# Patient Record
Sex: Female | Born: 1990 | Race: Asian | Hispanic: No | Marital: Single | Smoking: Never smoker
Health system: Southern US, Community
[De-identification: ages and names within clinical notes are randomized; demographics above are authoritative.]

## PROBLEM LIST (undated history)

## (undated) DIAGNOSIS — Z789 Other specified health status: Secondary | ICD-10-CM

## (undated) DIAGNOSIS — B1910 Unspecified viral hepatitis B without hepatic coma: Secondary | ICD-10-CM

## (undated) HISTORY — PX: NO PAST SURGERIES: SHX2092

---

## 2019-01-20 ENCOUNTER — Inpatient Hospital Stay (HOSPITAL_COMMUNITY)
Admission: AD | Admit: 2019-01-20 | Discharge: 2019-01-20 | Disposition: A | Discharge status: Home / Self Care | Payer: Medicaid Other | Attending: Obstetrics & Gynecology | Admitting: Obstetrics & Gynecology

## 2019-01-20 ENCOUNTER — Encounter (HOSPITAL_COMMUNITY): Payer: Self-pay

## 2019-01-20 DIAGNOSIS — E860 Dehydration: Secondary | ICD-10-CM

## 2019-01-20 DIAGNOSIS — O210 Mild hyperemesis gravidarum: Secondary | ICD-10-CM

## 2019-01-20 DIAGNOSIS — Z3A09 9 weeks gestation of pregnancy: Secondary | ICD-10-CM | POA: Insufficient documentation

## 2019-01-20 DIAGNOSIS — O211 Hyperemesis gravidarum with metabolic disturbance: Secondary | ICD-10-CM | POA: Insufficient documentation

## 2019-01-20 LAB — URINALYSIS, ROUTINE W REFLEX MICROSCOPIC
Bilirubin Urine: NEGATIVE
Glucose, UA: 50 mg/dL — AB
Hgb urine dipstick: NEGATIVE
Ketones, ur: 5 mg/dL — AB
Nitrite: NEGATIVE
Protein, ur: NEGATIVE mg/dL
Specific Gravity, Urine: 1.031 — ABNORMAL HIGH (ref 1.005–1.030)
pH: 5 (ref 5.0–8.0)

## 2019-01-20 LAB — POCT PREGNANCY, URINE: Preg Test, Ur: POSITIVE — AB

## 2019-01-20 MED ORDER — PROMETHAZINE HCL 25 MG/ML IJ SOLN
12.5000 mg | Freq: Four times a day (QID) | INTRAMUSCULAR | Status: DC | PRN
  Administered 2019-01-20: 12.5 mg via INTRAVENOUS
  Filled 2019-01-20: qty 1

## 2019-01-20 MED ORDER — LACTATED RINGERS IV BOLUS
1000.0000 mL | Freq: Once | INTRAVENOUS | Status: AC
  Administered 2019-01-20: 17:00:00 1000 mL via INTRAVENOUS

## 2019-01-20 MED ORDER — PROMETHAZINE HCL 25 MG PO TABS: 12.5000 mg | ORAL_TABLET | Freq: Four times a day (QID) | ORAL | 0 refills | Status: DC | PRN

## 2019-01-20 MED ORDER — SCOPOLAMINE 1 MG/3DAYS TD PT72
1.0000 | MEDICATED_PATCH | TRANSDERMAL | Status: DC
  Administered 2019-01-20: 1.5 mg via TRANSDERMAL
  Filled 2019-01-20: qty 1

## 2019-01-20 MED ORDER — SCOPOLAMINE 1 MG/3DAYS TD PT72: 1.0000 | MEDICATED_PATCH | TRANSDERMAL | 1 refills | Status: DC

## 2019-01-20 NOTE — MAU Provider Note (Signed)
History     CSN: [id number]  Arrival date and time: 01/20/19 1458   First Provider Initiated Contact with Patient 01/20/19 1619      Chief Complaint  Patient presents with  . Nausea   28 y.o. G1 @[redacted]w[redacted]d  by sure LMP presenting with N/V. Reports nausea x2 weeks, mostly in am. Had 2 episodes of emesis. She is able to tolerate liquids, pasta, rice, and noodles. Denies abd pain or VB. No diarrhea. No sick contacts.   OB History    Gravida  1   Para      Term      Preterm      AB      Living        SAB      TAB      Ectopic      Multiple      Live Births              History reviewed. No pertinent past medical history.  History reviewed. No pertinent surgical history.  Family History  Problem Relation Age of Onset  . Healthy Mother   . Healthy Father     Social History   Tobacco Use  . Smoking status: Never Smoker  . Smokeless tobacco: Never Used  Substance Use Topics  . Alcohol use: Not Currently    Comment: Socillay  . Drug use: Never    Allergies: No Known Allergies  No medications prior to admission.    Review of Systems  Constitutional: Negative for chills and fever.  Gastrointestinal: Positive for nausea and vomiting. Negative for abdominal pain, constipation and diarrhea.  Genitourinary: Negative for vaginal bleeding.   Physical Exam   Blood pressure 109/65, pulse 82, temperature 99.2 F (37.3 C), temperature source Oral, resp. rate 14, height 5\' 1"  (1.549 m), weight 50.7 kg, last menstrual period 11/16/2018, SpO2 100 %.  Physical Exam  Nursing note and vitals reviewed. Constitutional: She is oriented to person, place, and time. She appears well-developed and well-nourished. No distress.  HENT:  Head: Normocephalic and atraumatic.  Neck: Normal range of motion.  Cardiovascular: Normal rate.  Respiratory: Effort normal. No respiratory distress.  GI: Soft. She exhibits no distension and no mass. There is no abdominal tenderness.  There is no rebound and no guarding.  Musculoskeletal: Normal range of motion.  Neurological: She is alert and oriented to person, place, and time.  Skin: Skin is warm and dry.  Psychiatric: She has a normal mood and affect.   Results for orders placed or performed during the hospital encounter of 01/20/19 (from the past 24 hour(s))  Urinalysis, Routine w reflex microscopic     Status: Abnormal   Collection Time: 01/20/19  3:41 PM  Result Value Ref Range   Color, Urine YELLOW YELLOW   APPearance TURBID (A) CLEAR   Specific Gravity, Urine 1.031 (H) 1.005 - 1.030   pH 5.0 5.0 - 8.0   Glucose, UA 50 (A) NEGATIVE mg/dL   Hgb urine dipstick NEGATIVE NEGATIVE   Bilirubin Urine NEGATIVE NEGATIVE   Ketones, ur 5 (A) NEGATIVE mg/dL   Protein, ur NEGATIVE NEGATIVE mg/dL   Nitrite NEGATIVE NEGATIVE   Leukocytes,Ua SMALL (A) NEGATIVE   RBC / HPF 0-5 0 - 5 RBC/hpf   WBC, UA 11-20 0 - 5 WBC/hpf   Bacteria, UA MANY (A) NONE SEEN   Squamous Epithelial / LPF 21-50 0 - 5   Mucus PRESENT   Pregnancy, urine POC     Status: Abnormal  Collection Time: 01/20/19  3:55 PM  Result Value Ref Range   Preg Test, Ur POSITIVE (A) NEGATIVE   MAU Course  Procedures  MDM Labs ordered and reviewed. Feeling better after meds and IVF. No emesis. Tolerating po. UA with many bacteria, pt asymptomatic, will send culture. Stable for discharge home.  Assessment and Plan   1. [redacted] weeks gestation of pregnancy   2. Morning sickness   3. Dehydration    Discharge home Follow up with OB provider of choice to start care Rx Phenergan Rx Scopolamine Start PNV Maintain hydration Return precautions  Allergies as of 01/20/2019   No Known Allergies     Medication List    TAKE these medications   promethazine 25 MG tablet Commonly known as: PHENERGAN Take 0.5-1 tablets (12.5-25 mg total) by mouth every 6 (six) hours as needed for nausea or vomiting.   scopolamine 1 MG/3DAYS Commonly known as:  TRANSDERM-SCOP Place 1 patch (1.5 mg total) onto the skin every 3 (three) days. Start taking on: January 23, 2019      Krystal Lloyd, [place] 01/20/2019, 7:12 PM

## 2019-01-20 NOTE — Discharge Instructions (Signed)
Morning Sickness  Morning sickness is when you feel sick to your stomach (nauseous) during pregnancy. You may feel sick to your stomach and throw up (vomit). You may feel sick in the morning, but you can feel this way at any time of day. Some women feel very sick to their stomach and cannot stop throwing up (hyperemesis gravidarum). Follow these instructions at home: Medicines  Take over-the-counter and prescription medicines only as told by your doctor. Do not take any medicines until you talk with your doctor about them first.  Taking multivitamins before getting pregnant can stop or lessen the harshness of morning sickness. Eating and drinking  Eat dry toast or crackers before getting out of bed.  Eat 5 or 6 small meals a day.  Eat dry and bland foods like rice and baked potatoes.  Do not eat greasy, fatty, or spicy foods.  Have someone cook for you if the smell of food causes you to feel sick or throw up.  If you feel sick to your stomach after taking prenatal vitamins, take them at night or with a snack.  Eat protein when you need a snack. Nuts, yogurt, and cheese are good choices.  Drink fluids throughout the day.  Try ginger ale made with real ginger, ginger tea made from fresh grated ginger, or ginger candies. General instructions  Do not use any products that have nicotine or tobacco in them, such as cigarettes and e-cigarettes. If you need help quitting, ask your doctor.  Use an air purifier to keep the air in your house free of smells.  Get lots of fresh air.  Try to avoid smells that make you feel sick.  Try: ? Wearing a bracelet that is used for seasickness (acupressure wristband). ? Going to a doctor who puts thin needles into certain body points (acupuncture) to improve how you feel. Contact a doctor if:  You need medicine to feel better.  You feel dizzy or light-headed.  You are losing weight. Get help right away if:  You feel very sick to your  stomach and cannot stop throwing up.  You pass out (faint).  You have very bad pain in your belly. Summary  Morning sickness is when you feel sick to your stomach (nauseous) during pregnancy.  You may feel sick in the morning, but you can feel this way at any time of day.  Making some changes to what you eat may help your symptoms go away. This information is not intended to replace advice given to you by your health care provider. Make sure you discuss any questions you have with your health care provider. Document Released: 04/24/2004 Document Revised: 02/27/2017 Document Reviewed: 04/17/2016 Elsevier Patient Education  2020 [facility]     Phone: [phone]  [facility] [place] [facility]  Phone: [phone]  [facility] [place] [facility] [place]  Phone: [place] [facility] [employer] at [place]                           Phone: [place] [facility] [place] [facility]          Phone: [phone]  [facility]    Phone: [phone]   [facility] [place])    Phone: [facility]    Phone: [place]  Phone: [place]    Phone: [phone]  [place] [facility]  Phone: [phone]  [place]               Phone: [phone]  [facility]   Phone: [phone]  [facility]    Phone: [phone]

## 2019-01-20 NOTE — MAU Note (Signed)
Pt has been experiencing nausea and vomiting for 2 weeks.  Her LMP was Aug 18. Denies bleeding or pain. Had positive HPT.

## 2019-01-21 LAB — CULTURE, OB URINE

## 2019-04-01 NOTE — L&D Delivery Note (Signed)
Delivery Note Tyannah Bazzle is a 29 y.o. G1P0000 at [redacted]w[redacted]d admitted for SOL.  Labor course: AROM only, progressed well on her own ROM: 3h 56m with clear fluid  Called to room by nurse who feels anterior lip. Anterior lip reduced by me as pt pushed and was able to push past it. At 1908 a viable female was delivered via spontaneous vaginal delivery (Presentation: LOA). Shoulders were snug, she was placed in [facility] and infant delivered w/o difficulty.  Infant placed directly on mom's abdomen for bonding/skin-to-skin. Delayed cord clamping x 18min, then cord clamped x 2, and cut by FOB.  APGAR: 9,9 ; weight: pending at time of note.  40 units of pitocin diluted in 1000cc LR was infused rapidly IV per protocol. The placenta separated spontaneously and delivered via CCT and maternal pushing effort.  It was inspected and appears to be intact with a 3 VC.  Placenta/Cord with the following complications: marginal insertion .  Cord pH: not done Steady trickle, boggy uterus firmed w/ massage, then steady trickle again. Given 840mcg cytotec total- 448mcg SL and 470mcg PR with nice continued firming of uterus.   Intrapartum complications:  None Anesthesia:  local and epidural Episiotomy: none Lacerations:  2nd degree Suture Repair: 3.0 vicryl Est. Blood Loss (mL): 300 Sponge and instrument count were correct x2.  Mom to postpartum.  Baby to Couplet care / Skin to Skin. Placenta to L&D. Plans to breast and bottlefeed Contraception: none Circ: no  [name] CNM, [facility] 08/16/2019 7:46 PM

## 2019-04-08 ENCOUNTER — Ambulatory Visit (INDEPENDENT_AMBULATORY_CARE_PROVIDER_SITE_OTHER): Payer: Medicaid Other | Admitting: Family Medicine

## 2019-04-08 ENCOUNTER — Other Ambulatory Visit (HOSPITAL_COMMUNITY)
Admission: RE | Admit: 2019-04-08 | Discharge: 2019-04-08 | Disposition: A | Discharge status: Home / Self Care | Payer: Medicaid Other | Source: Ambulatory Visit | Attending: Family Medicine | Admitting: Family Medicine

## 2019-04-08 ENCOUNTER — Encounter: Payer: Self-pay | Admitting: Family Medicine

## 2019-04-08 ENCOUNTER — Other Ambulatory Visit: Payer: Self-pay

## 2019-04-08 VITALS — BP 125/70 | HR 81 | Wt 128.0 lb

## 2019-04-08 DIAGNOSIS — Z3402 Encounter for supervision of normal first pregnancy, second trimester: Secondary | ICD-10-CM | POA: Diagnosis present

## 2019-04-08 DIAGNOSIS — O0932 Supervision of pregnancy with insufficient antenatal care, second trimester: Secondary | ICD-10-CM

## 2019-04-08 DIAGNOSIS — Z3A20 20 weeks gestation of pregnancy: Secondary | ICD-10-CM | POA: Diagnosis not present

## 2019-04-08 DIAGNOSIS — Z3403 Encounter for supervision of normal first pregnancy, third trimester: Secondary | ICD-10-CM | POA: Insufficient documentation

## 2019-04-08 NOTE — Progress Notes (Signed)
  Subjective:  Krystal Lloyd is a G1P0 [redacted]w[redacted]d being seen today for her first obstetrical visit.  Her obstetrical history is significant for first pregnancy and late to care. unplanned pregnancy, but excited. FOB is fiancee. Patient does intend to breast feed. Pregnancy history fully reviewed.  Patient reports no complaints.  BP 125/70   Pulse 81   Wt 128 lb (58.1 kg)   LMP 11/16/2018   BMI 24.19 kg/m   HISTORY: OB History  Gravida Para Term Preterm AB Living  1            SAB TAB Ectopic Multiple Live Births               # Outcome Date GA Lbr Len/2nd Weight Sex Delivery Anes PTL Lv  1 Current             History reviewed. No pertinent past medical history.  History reviewed. No pertinent surgical history.  Family History  Problem Relation Age of Onset  . Healthy Mother   . Healthy Father   . Cancer Neg Hx      Exam  BP 125/70   Pulse 81   Wt 128 lb (58.1 kg)   LMP 11/16/2018   BMI 24.19 kg/m   Chaperone present during exam  CONSTITUTIONAL: Well-developed, well-nourished female in no acute distress.  HENT:  Normocephalic, atraumatic, External right and left ear normal. Oropharynx is clear and moist EYES: Conjunctivae and EOM are normal. Pupils are equal, round, and reactive to light. No scleral icterus.  NECK: Normal range of motion, supple, no masses.  Normal thyroid.  CARDIOVASCULAR: Normal heart rate noted, regular rhythm RESPIRATORY: Clear to auscultation bilaterally. Effort and breath sounds normal, no problems with respiration noted. BREASTS: Symmetric in size. No masses, skin changes, nipple drainage, or lymphadenopathy. ABDOMEN: Soft, normal bowel sounds, no distention noted.  No tenderness, rebound or guarding.  PELVIC: Normal appearing external genitalia; normal appearing vaginal mucosa and cervix. No abnormal discharge noted. Normal uterine size, no other palpable masses, no uterine or adnexal tenderness. MUSCULOSKELETAL: Normal range of motion. No  tenderness.  No cyanosis, clubbing, or edema.  2+ distal pulses. SKIN: Skin is warm and dry. No rash noted. Not diaphoretic. No erythema. No pallor. NEUROLOGIC: Alert and oriented to person, place, and time. Normal reflexes, muscle tone coordination. No cranial nerve deficit noted. PSYCHIATRIC: Normal mood and affect. Normal behavior. Normal judgment and thought content.    Assessment:    Pregnancy: G1P0 Patient Active Problem List   Diagnosis Date Noted  . Encounter for supervision of normal first pregnancy in second trimester 04/08/2019      Plan:   1. Encounter for supervision of normal first pregnancy in second trimester FHT and FH normal - Obstetric Panel (and HIV)*LC - [place] MFM OB DETAIL +14 WK; Future - Urine Culture - Cytology - PAP( [place]) - AFP, Quad Screen (15.0-22.6)*LC - [place] or [place]  2. Late prenatal care affecting pregnancy in second trimester - [place] MFM OB DETAIL +14 WK; Future - [place] or [place]     Problem list reviewed and updated. 75% of 30 min visit spent on counseling and coordination of care.     Truett Mainland 04/08/2019

## 2019-04-08 NOTE — Progress Notes (Signed)
Patient presents for New ob visit at 20.3 weeks based of her LMP. Patient is late prenatal care. Kathrene Alu RN

## 2019-04-10 LAB — URINE CULTURE

## 2019-04-11 LAB — CYTOLOGY - PAP
Chlamydia: NEGATIVE
Comment: NEGATIVE
Comment: NORMAL
Diagnosis: NEGATIVE
Neisseria Gonorrhea: NEGATIVE

## 2019-04-13 ENCOUNTER — Encounter: Payer: Self-pay | Admitting: Family Medicine

## 2019-04-13 DIAGNOSIS — B1910 Unspecified viral hepatitis B without hepatic coma: Secondary | ICD-10-CM | POA: Insufficient documentation

## 2019-04-13 DIAGNOSIS — B181 Chronic viral hepatitis B without delta-agent: Secondary | ICD-10-CM | POA: Insufficient documentation

## 2019-04-13 LAB — AFP TETRA
DIA Mom Value: 0.51
DIA Value (EIA): 115.69 pg/mL
DSR (By Age)    1 IN: 892
DSR (Second Trimester) 1 IN: 10000
Gestational Age: 20.3 WEEKS
MSAFP Mom: 0.92
MSAFP: 62 ng/mL
MSHCG Mom: 0.65
MSHCG: 17796 m[IU]/mL
Maternal Age At EDD: 27.5 yr
Osb Risk: 10000
T18 (By Age): 1:3473 {titer}
Test Results:: NEGATIVE
Weight: 123 [lb_av]
uE3 Mom: 1.43
uE3 Value: 3.73 ng/mL

## 2019-04-13 LAB — OBSTETRIC PANEL, INCLUDING HIV
Antibody Screen: NEGATIVE
Basophils Absolute: 0.1 10*3/uL (ref 0.0–0.2)
Basos: 1 %
EOS (ABSOLUTE): 0.1 10*3/uL (ref 0.0–0.4)
Eos: 2 %
HIV Screen 4th Generation wRfx: NONREACTIVE
Hematocrit: 35.8 % (ref 34.0–46.6)
Hemoglobin: 12.1 g/dL (ref 11.1–15.9)
Immature Grans (Abs): 0.1 10*3/uL (ref 0.0–0.1)
Immature Granulocytes: 1 %
Lymphocytes Absolute: 1.9 10*3/uL (ref 0.7–3.1)
Lymphs: 22 %
MCH: 28.5 pg (ref 26.6–33.0)
MCHC: 33.8 g/dL (ref 31.5–35.7)
MCV: 84 fL (ref 79–97)
Monocytes Absolute: 0.7 10*3/uL (ref 0.1–0.9)
Monocytes: 8 %
Neutrophils Absolute: 5.9 10*3/uL (ref 1.4–7.0)
Neutrophils: 66 %
Platelets: 198 10*3/uL (ref 150–450)
RBC: 4.24 x10E6/uL (ref 3.77–5.28)
RDW: 19.5 % — ABNORMAL HIGH (ref 11.7–15.4)
RPR Ser Ql: NONREACTIVE
Rh Factor: POSITIVE
Rubella Antibodies, IGG: 13.6 index (ref >0.99)
WBC: 8.9 10*3/uL (ref 3.4–10.8)

## 2019-04-13 LAB — HEPATITIS B SURFACE AG, CONFIRM: HBsAG Confirmation: POSITIVE — AB

## 2019-04-20 ENCOUNTER — Other Ambulatory Visit: Payer: Self-pay

## 2019-04-29 ENCOUNTER — Encounter (HOSPITAL_COMMUNITY): Payer: Self-pay

## 2019-04-29 ENCOUNTER — Other Ambulatory Visit (HOSPITAL_COMMUNITY): Payer: Self-pay | Admitting: *Deleted

## 2019-04-29 ENCOUNTER — Ambulatory Visit (HOSPITAL_COMMUNITY)
Admission: RE | Admit: 2019-04-29 | Discharge: 2019-04-29 | Disposition: A | Discharge status: Home / Self Care | Payer: Medicaid Other | Source: Ambulatory Visit | Attending: Obstetrics and Gynecology | Admitting: Obstetrics and Gynecology

## 2019-04-29 ENCOUNTER — Ambulatory Visit (HOSPITAL_COMMUNITY): Payer: Medicaid Other | Admitting: *Deleted

## 2019-04-29 ENCOUNTER — Other Ambulatory Visit: Payer: Self-pay

## 2019-04-29 DIAGNOSIS — O3412 Maternal care for benign tumor of corpus uteri, second trimester: Secondary | ICD-10-CM

## 2019-04-29 DIAGNOSIS — O0932 Supervision of pregnancy with insufficient antenatal care, second trimester: Secondary | ICD-10-CM

## 2019-04-29 DIAGNOSIS — Z3A23 23 weeks gestation of pregnancy: Secondary | ICD-10-CM

## 2019-04-29 DIAGNOSIS — D259 Leiomyoma of uterus, unspecified: Secondary | ICD-10-CM

## 2019-04-29 DIAGNOSIS — Z3402 Encounter for supervision of normal first pregnancy, second trimester: Secondary | ICD-10-CM

## 2019-05-05 ENCOUNTER — Ambulatory Visit (INDEPENDENT_AMBULATORY_CARE_PROVIDER_SITE_OTHER): Payer: Medicaid Other | Admitting: Family Medicine

## 2019-05-05 ENCOUNTER — Other Ambulatory Visit: Payer: Self-pay

## 2019-05-05 VITALS — BP 108/64 | HR 80 | Wt 132.0 lb

## 2019-05-05 DIAGNOSIS — Z3402 Encounter for supervision of normal first pregnancy, second trimester: Secondary | ICD-10-CM

## 2019-05-05 DIAGNOSIS — B1910 Unspecified viral hepatitis B without hepatic coma: Secondary | ICD-10-CM

## 2019-05-05 DIAGNOSIS — Z3A24 24 weeks gestation of pregnancy: Secondary | ICD-10-CM

## 2019-05-05 DIAGNOSIS — O98412 Viral hepatitis complicating pregnancy, second trimester: Secondary | ICD-10-CM

## 2019-05-05 NOTE — Progress Notes (Signed)
   PRENATAL VISIT NOTE  Subjective:  Krystal Lloyd is a 29 y.o. G1P0 at [redacted]w[redacted]d being seen today for ongoing prenatal care.  She is currently monitored for the following issues for this high-risk pregnancy and has Encounter for supervision of normal first pregnancy in second trimester and Hepatitis B affecting pregnancy, antepartum on their problem list.  Patient reports no complaints.  Contractions: Not present. Vag. Bleeding: None.  Movement: Present. Denies leaking of fluid.   The following portions of the patient's history were reviewed and updated as appropriate: allergies, current medications, past family history, past medical history, past social history, past surgical history and problem list.   Objective:   Vitals:   05/05/19 1332  BP: 108/64  Pulse: 80  Weight: 132 lb (59.9 kg)    Fetal Status: Fetal Heart Rate (bpm): 155 Fundal Height: 24 cm Movement: Present     General:  Alert, oriented and cooperative. Patient is in no acute distress.  Skin: Skin is warm and dry. No rash noted.   Cardiovascular: Normal heart rate noted  Respiratory: Normal respiratory effort, no problems with respiration noted  Abdomen: Soft, gravid, appropriate for gestational age.  Pain/Pressure: Absent     Pelvic: Cervical exam deferred        Extremities: Normal range of motion.  Edema: None  Mental Status: Normal mood and affect. Normal behavior. Normal judgment and thought content.   Assessment and Plan:  Pregnancy: G1P0 at [redacted]w[redacted]d 1. Encounter for supervision of normal first pregnancy in second trimester FHT and FH normal - Comprehensive metabolic panel - Hepatitis B DNA, Ultraquantitative, PCR  2. Hepatitis B affecting pregnancy, antepartum Check viral load and CMP. - Comprehensive metabolic panel - Hepatitis B DNA, Ultraquantitative, PCR  Preterm labor symptoms and general obstetric precautions including but not limited to vaginal bleeding, contractions, leaking of fluid and fetal movement  were reviewed in detail with the patient. Please refer to After Visit Summary for other counseling recommendations.   Return in about 4 weeks (around 06/02/2019) for OB f/u, 2 hr GTT, In Office.  Future Appointments  Date Time Provider [place]  06/02/2019  8:45 AM Truett Mainland, DO [facility] None  06/03/2019  1:15 PM [place] NURSE [place] [facility]  06/03/2019  1:15 PM [place], DO

## 2019-05-07 LAB — COMPREHENSIVE METABOLIC PANEL
ALT: 9 IU/L (ref 0–32)
AST: 16 IU/L (ref 0–40)
Albumin/Globulin Ratio: 1.5 (ref 1.2–2.2)
Albumin: 4.3 g/dL (ref 3.9–5.0)
Alkaline Phosphatase: 58 IU/L (ref 39–117)
BUN/Creatinine Ratio: 15 (ref 9–23)
BUN: 8 mg/dL (ref 6–20)
Bilirubin Total: 0.2 mg/dL (ref 0.0–1.2)
CO2: 19 mmol/L — ABNORMAL LOW (ref 20–29)
Calcium: 9.3 mg/dL (ref 8.7–10.2)
Chloride: 104 mmol/L (ref 96–106)
Creatinine, Ser: 0.53 mg/dL — ABNORMAL LOW (ref 0.57–1.00)
GFR calc Af Amer: 150 mL/min/{1.73_m2} (ref >59)
GFR calc non Af Amer: 131 mL/min/{1.73_m2} (ref >59)
Globulin, Total: 2.8 g/dL (ref 1.5–4.5)
Glucose: 93 mg/dL (ref 65–99)
Potassium: 3.9 mmol/L (ref 3.5–5.2)
Sodium: 138 mmol/L (ref 134–144)
Total Protein: 7.1 g/dL (ref 6.0–8.5)

## 2019-05-07 LAB — HEPATITIS B DNA, ULTRAQUANTITATIVE, PCR
HBV DNA SERPL PCR-ACNC: 80 IU/mL
HBV DNA SERPL PCR-LOG IU: 1.903 log10 IU/mL

## 2019-06-02 ENCOUNTER — Other Ambulatory Visit: Payer: Self-pay

## 2019-06-02 ENCOUNTER — Ambulatory Visit (INDEPENDENT_AMBULATORY_CARE_PROVIDER_SITE_OTHER): Payer: Medicaid Other | Admitting: Family Medicine

## 2019-06-02 VITALS — BP 116/68 | HR 92

## 2019-06-02 DIAGNOSIS — Z3402 Encounter for supervision of normal first pregnancy, second trimester: Secondary | ICD-10-CM

## 2019-06-02 DIAGNOSIS — B1910 Unspecified viral hepatitis B without hepatic coma: Secondary | ICD-10-CM

## 2019-06-02 DIAGNOSIS — Z23 Encounter for immunization: Secondary | ICD-10-CM

## 2019-06-02 DIAGNOSIS — O98413 Viral hepatitis complicating pregnancy, third trimester: Secondary | ICD-10-CM

## 2019-06-02 DIAGNOSIS — Z3A28 28 weeks gestation of pregnancy: Secondary | ICD-10-CM

## 2019-06-02 NOTE — Progress Notes (Signed)
   PRENATAL VISIT NOTE  Subjective:  Krystal Lloyd is a 28 y.o. G1P0 at [redacted]w[redacted]d being seen today for ongoing prenatal care.  She is currently monitored for the following issues for this high-risk pregnancy and has Encounter for supervision of normal first pregnancy in second trimester and Hepatitis B affecting pregnancy, antepartum on their problem list.  Patient reports no complaints.  Contractions: Not present. Vag. Bleeding: None.  Movement: Present. Denies leaking of fluid.   The following portions of the patient's history were reviewed and updated as appropriate: allergies, current medications, past family history, past medical history, past social history, past surgical history and problem list.   Objective:   Vitals:   06/02/19 0853  BP: 116/68  Pulse: 92    Fetal Status: Fetal Heart Rate (bpm): 148 Fundal Height: 27 cm Movement: Present     General:  Alert, oriented and cooperative. Patient is in no acute distress.  Skin: Skin is warm and dry. No rash noted.   Cardiovascular: Normal heart rate noted  Respiratory: Normal respiratory effort, no problems with respiration noted  Abdomen: Soft, gravid, appropriate for gestational age.  Pain/Pressure: Present     Pelvic: Cervical exam deferred        Extremities: Normal range of motion.  Edema: None  Mental Status: Normal mood and affect. Normal behavior. Normal judgment and thought content.   Assessment and Plan:  Pregnancy: G1P0 at [redacted]w[redacted]d 1. Encounter for supervision of normal first pregnancy in second trimester Return in tomorrow AM for 2hr GTT. FH and FHT normal.  2. Hepatitis B affecting pregnancy, antepartum Viral load low. Will need to connect with ID in third trimester for treatment post delivery.  Preterm labor symptoms and general obstetric precautions including but not limited to vaginal bleeding, contractions, leaking of fluid and fetal movement were reviewed in detail with the patient. Please refer to After Visit Summary  for other counseling recommendations.   Return in about 2 weeks (around 06/16/2019) for OB f/u, In Office.  Future Appointments  Date Time Provider [place]  06/03/2019  8:30 AM [facility] NURSE [facility] None  06/03/2019  1:15 PM [facility] NURSE [facility] MFC-US  06/03/2019  1:15 PM [place] 4 [facility] MFC-US  06/16/2019 11:00 AM Truett Mainland, DO [facility] None    Truett Mainland, DO

## 2019-06-03 ENCOUNTER — Ambulatory Visit: Payer: Medicaid Other

## 2019-06-03 ENCOUNTER — Ambulatory Visit (HOSPITAL_COMMUNITY): Payer: Medicaid Other | Admitting: *Deleted

## 2019-06-03 ENCOUNTER — Encounter (HOSPITAL_COMMUNITY): Payer: Self-pay

## 2019-06-03 ENCOUNTER — Ambulatory Visit (HOSPITAL_COMMUNITY)
Admission: RE | Admit: 2019-06-03 | Discharge: 2019-06-03 | Disposition: A | Discharge status: Home / Self Care | Payer: Medicaid Other | Source: Ambulatory Visit | Attending: Obstetrics and Gynecology | Admitting: Obstetrics and Gynecology

## 2019-06-03 ENCOUNTER — Other Ambulatory Visit (HOSPITAL_COMMUNITY): Payer: Self-pay | Admitting: *Deleted

## 2019-06-03 DIAGNOSIS — Z3A28 28 weeks gestation of pregnancy: Secondary | ICD-10-CM | POA: Diagnosis not present

## 2019-06-03 DIAGNOSIS — Z3402 Encounter for supervision of normal first pregnancy, second trimester: Secondary | ICD-10-CM

## 2019-06-03 DIAGNOSIS — O3413 Maternal care for benign tumor of corpus uteri, third trimester: Secondary | ICD-10-CM | POA: Diagnosis not present

## 2019-06-03 DIAGNOSIS — D259 Leiomyoma of uterus, unspecified: Secondary | ICD-10-CM | POA: Diagnosis present

## 2019-06-03 DIAGNOSIS — Z362 Encounter for other antenatal screening follow-up: Secondary | ICD-10-CM

## 2019-06-03 DIAGNOSIS — O3410 Maternal care for benign tumor of corpus uteri, unspecified trimester: Secondary | ICD-10-CM | POA: Diagnosis present

## 2019-06-03 NOTE — Progress Notes (Signed)
Patient sent to lab for 28 week labs. Kathrene Alu RN

## 2019-06-04 LAB — CBC
Hematocrit: 36.2 % (ref 34.0–46.6)
Hemoglobin: 12.6 g/dL (ref 11.1–15.9)
MCH: 31.2 pg (ref 26.6–33.0)
MCHC: 34.8 g/dL (ref 31.5–35.7)
MCV: 90 fL (ref 79–97)
Platelets: 182 10*3/uL (ref 150–450)
RBC: 4.04 x10E6/uL (ref 3.77–5.28)
RDW: 13 % (ref 11.7–15.4)
WBC: 8.2 10*3/uL (ref 3.4–10.8)

## 2019-06-04 LAB — HIV ANTIBODY (ROUTINE TESTING W REFLEX): HIV Screen 4th Generation wRfx: NONREACTIVE

## 2019-06-04 LAB — GLUCOSE TOLERANCE, 2 HOURS W/ 1HR
Glucose, 1 hour: 97 mg/dL (ref 65–179)
Glucose, 2 hour: 96 mg/dL (ref 65–152)
Glucose, Fasting: 70 mg/dL (ref 65–91)

## 2019-06-04 LAB — RPR: RPR Ser Ql: NONREACTIVE

## 2019-06-16 ENCOUNTER — Ambulatory Visit (INDEPENDENT_AMBULATORY_CARE_PROVIDER_SITE_OTHER): Payer: Medicaid Other | Admitting: Family Medicine

## 2019-06-16 ENCOUNTER — Other Ambulatory Visit: Payer: Self-pay

## 2019-06-16 VITALS — BP 110/66 | HR 94 | Wt 141.0 lb

## 2019-06-16 DIAGNOSIS — O0932 Supervision of pregnancy with insufficient antenatal care, second trimester: Secondary | ICD-10-CM

## 2019-06-16 DIAGNOSIS — O98419 Viral hepatitis complicating pregnancy, unspecified trimester: Secondary | ICD-10-CM

## 2019-06-16 DIAGNOSIS — B1910 Unspecified viral hepatitis B without hepatic coma: Secondary | ICD-10-CM

## 2019-06-16 DIAGNOSIS — Z3402 Encounter for supervision of normal first pregnancy, second trimester: Secondary | ICD-10-CM

## 2019-06-16 DIAGNOSIS — Z3A30 30 weeks gestation of pregnancy: Secondary | ICD-10-CM

## 2019-06-16 NOTE — Progress Notes (Signed)
   PRENATAL VISIT NOTE  Subjective:  Krystal Lloyd is a 29 y.o. G1P0 at [redacted]w[redacted]d being seen today for ongoing prenatal care.  She is currently monitored for the following issues for this high-risk pregnancy and has Encounter for supervision of normal first pregnancy in second trimester and Hepatitis B affecting pregnancy, antepartum on their problem list.  Patient reports no complaints.  Contractions: Not present. Vag. Bleeding: None.  Movement: Present. Denies leaking of fluid.   The following portions of the patient's history were reviewed and updated as appropriate: allergies, current medications, past family history, past medical history, past social history, past surgical history and problem list.   Objective:   Vitals:   06/16/19 1107  BP: 110/66  Pulse: 94  Weight: 141 lb (64 kg)    Fetal Status: Fetal Heart Rate (bpm): 138 Fundal Height: 30 cm Movement: Present  Presentation: Vertex  General:  Alert, oriented and cooperative. Patient is in no acute distress.  Skin: Skin is warm and dry. No rash noted.   Cardiovascular: Normal heart rate noted  Respiratory: Normal respiratory effort, no problems with respiration noted  Abdomen: Soft, gravid, appropriate for gestational age.  Pain/Pressure: Absent     Pelvic: Cervical exam deferred        Extremities: Normal range of motion.  Edema: None  Mental Status: Normal mood and affect. Normal behavior. Normal judgment and thought content.   Assessment and Plan:  Pregnancy: G1P0 at 103w2d 1. Encounter for supervision of normal first pregnancy in second trimester FHT and FH normal  2. Hepatitis B affecting pregnancy, antepartum Low viral load  3. Late prenatal care affecting pregnancy in second trimester   Preterm labor symptoms and general obstetric precautions including but not limited to vaginal bleeding, contractions, leaking of fluid and fetal movement were reviewed in detail with the patient. Please refer to After Visit Summary for  other counseling recommendations.   No follow-ups on file.  Future Appointments  Date Time Provider [place]  06/30/2019  4:00 PM Truett Mainland, DO [facility] None  07/07/2019 12:45 PM [place] NURSE [place] [facility]-US  07/07/2019 12:45 PM [place] 2 [facility] MFC-US    Truett Mainland, DO

## 2019-06-30 ENCOUNTER — Other Ambulatory Visit: Payer: Self-pay

## 2019-06-30 ENCOUNTER — Ambulatory Visit (INDEPENDENT_AMBULATORY_CARE_PROVIDER_SITE_OTHER): Payer: Medicaid Other | Admitting: Family Medicine

## 2019-06-30 VITALS — BP 115/67 | HR 85 | Wt 146.1 lb

## 2019-06-30 DIAGNOSIS — Z3402 Encounter for supervision of normal first pregnancy, second trimester: Secondary | ICD-10-CM

## 2019-06-30 DIAGNOSIS — Z3A32 32 weeks gestation of pregnancy: Secondary | ICD-10-CM

## 2019-06-30 DIAGNOSIS — O98413 Viral hepatitis complicating pregnancy, third trimester: Secondary | ICD-10-CM

## 2019-06-30 DIAGNOSIS — B1910 Unspecified viral hepatitis B without hepatic coma: Secondary | ICD-10-CM

## 2019-06-30 MED ORDER — FUTURO ABDOMINAL SUPPORT MISC: 1.0000 [IU] | Freq: Every day | 0 refills | Status: DC

## 2019-06-30 NOTE — Progress Notes (Signed)
   PRENATAL VISIT NOTE  Subjective:  Krystal Lloyd is a 29 y.o. G1P0 at [redacted]w[redacted]d being seen today for ongoing prenatal care.  She is currently monitored for the following issues for this high-risk pregnancy and has Encounter for supervision of normal first pregnancy in second trimester and Hepatitis B affecting pregnancy, antepartum on their problem list.  Patient reports no complaints.  Contractions: Irregular. Vag. Bleeding: None.  Movement: Present. Denies leaking of fluid.   The following portions of the patient's history were reviewed and updated as appropriate: allergies, current medications, past family history, past medical history, past social history, past surgical history and problem list.   Objective:   Vitals:   06/30/19 1556  BP: 115/67  Pulse: 85  Weight: 146 lb 1.3 oz (66.3 kg)    Fetal Status: Fetal Heart Rate (bpm): 150 Fundal Height: 32 cm Movement: Present  Presentation: Vertex  General:  Alert, oriented and cooperative. Patient is in no acute distress.  Skin: Skin is warm and dry. No rash noted.   Cardiovascular: Normal heart rate noted  Respiratory: Normal respiratory effort, no problems with respiration noted  Abdomen: Soft, gravid, appropriate for gestational age.  Pain/Pressure: Present     Pelvic: Cervical exam deferred        Extremities: Normal range of motion.  Edema: None  Mental Status: Normal mood and affect. Normal behavior. Normal judgment and thought content.   Assessment and Plan:  Pregnancy: G1P0 at [redacted]w[redacted]d 1. Encounter for supervision of normal first pregnancy in second trimester FHT and FH normal  2. Hepatitis B affecting pregnancy, antepartum   Preterm labor symptoms and general obstetric precautions including but not limited to vaginal bleeding, contractions, leaking of fluid and fetal movement were reviewed in detail with the patient. Please refer to After Visit Summary for other counseling recommendations.   Return in about 2 weeks (around  07/14/2019).  Future Appointments  Date Time Provider [place]  07/07/2019 12:45 PM [place] [facility]  07/07/2019 12:45 PM [place] 2 [facility] MFC-US    Truett Mainland, DO

## 2019-06-30 NOTE — Patient Instructions (Signed)
Levonorgestrel intrauterine device (IUD) What is this medicine? LEVONORGESTREL IUD (LEE voe nor jes trel) is a contraceptive (birth control) device. The device is placed inside the uterus by a healthcare professional. It is used to prevent pregnancy. This device can also be used to treat heavy bleeding that occurs during your period. This medicine may be used for other purposes; ask your health care provider or pharmacist if you have questions. COMMON BRAND NAME(S): Kyleena, LILETTA, Mirena, Skyla What should I tell my health care provider before I take this medicine? They need to know if you have any of these conditions:  abnormal Pap smear  cancer of the breast, uterus, or cervix  diabetes  endometritis  genital or pelvic infection now or in the past  have more than one sexual partner or your partner has more than one partner  heart disease  history of an ectopic or tubal pregnancy  immune system problems  IUD in place  liver disease or tumor  problems with blood clots or take blood-thinners  seizures  use intravenous drugs  uterus of unusual shape  vaginal bleeding that has not been explained  an unusual or allergic reaction to levonorgestrel, other hormones, silicone, or polyethylene, medicines, foods, dyes, or preservatives  pregnant or trying to get pregnant  breast-feeding How should I use this medicine? This device is placed inside the uterus by a health care professional. Talk to your pediatrician regarding the use of this medicine in children. Special care may be needed. Overdosage: If you think you have taken too much of this medicine contact a poison control center or emergency room at once. NOTE: This medicine is only for you. Do not share this medicine with others. What if I miss a dose? This does not apply. Depending on the brand of device you have inserted, the device will need to be replaced every 3 to 6 years if you wish to continue using this type  of birth control. What may interact with this medicine? Do not take this medicine with any of the following medications:  amprenavir  bosentan  fosamprenavir This medicine may also interact with the following medications:  aprepitant  armodafinil  barbiturate medicines for inducing sleep or treating seizures  bexarotene  boceprevir  griseofulvin  medicines to treat seizures like carbamazepine, ethotoin, felbamate, oxcarbazepine, phenytoin, topiramate  modafinil  pioglitazone  rifabutin  rifampin  rifapentine  some medicines to treat HIV infection like atazanavir, efavirenz, indinavir, lopinavir, nelfinavir, tipranavir, ritonavir  St. John's wort  warfarin This list may not describe all possible interactions. Give your health care provider a list of all the medicines, herbs, non-prescription drugs, or dietary supplements you use. Also tell them if you smoke, drink alcohol, or use illegal drugs. Some items may interact with your medicine. What should I watch for while using this medicine? Visit your doctor or health care professional for regular check ups. See your doctor if you or your partner has sexual contact with others, becomes HIV positive, or gets a sexual transmitted disease. This product does not protect you against HIV infection (AIDS) or other sexually transmitted diseases. You can check the placement of the IUD yourself by reaching up to the top of your vagina with clean fingers to feel the threads. Do not pull on the threads. It is a good habit to check placement after each menstrual period. Call your doctor right away if you feel more of the IUD than just the threads or if you cannot feel the threads at   all. The IUD may come out by itself. You may become pregnant if the device comes out. If you notice that the IUD has come out use a backup birth control method like condoms and call your health care provider. Using tampons will not change the position of the  IUD and are okay to use during your period. This IUD can be safely scanned with magnetic resonance imaging (MRI) only under specific conditions. Before you have an MRI, tell your healthcare provider that you have an IUD in place, and which type of IUD you have in place. What side effects may I notice from receiving this medicine? Side effects that you should report to your doctor or health care professional as soon as possible:  allergic reactions like skin rash, itching or hives, swelling of the face, lips, or tongue  fever, flu-like symptoms  genital sores  high blood pressure  no menstrual period for 6 weeks during use  pain, swelling, warmth in the leg  pelvic pain or tenderness  severe or sudden headache  signs of pregnancy  stomach cramping  sudden shortness of breath  trouble with balance, talking, or walking  unusual vaginal bleeding, discharge  yellowing of the eyes or skin Side effects that usually do not require medical attention (report to your doctor or health care professional if they continue or are bothersome):  acne  breast pain  change in sex drive or performance  changes in weight  cramping, dizziness, or faintness while the device is being inserted  headache  irregular menstrual bleeding within first 3 to 6 months of use  nausea This list may not describe all possible side effects. Call your doctor for medical advice about side effects. You may report side effects to FDA at 1-800-FDA-1088. Where should I keep my medicine? This does not apply. NOTE: This sheet is a summary. It may not cover all possible information. If you have questions about this medicine, talk to your doctor, pharmacist, or health care provider.  2020 Elsevier/Gold Standard (2018-01-26 13:22:01)  

## 2019-07-07 ENCOUNTER — Encounter (HOSPITAL_COMMUNITY): Payer: Self-pay

## 2019-07-07 ENCOUNTER — Ambulatory Visit (HOSPITAL_COMMUNITY)
Admission: RE | Admit: 2019-07-07 | Discharge: 2019-07-07 | Disposition: A | Discharge status: Home / Self Care | Payer: Medicaid Other | Source: Ambulatory Visit | Attending: Obstetrics and Gynecology | Admitting: Obstetrics and Gynecology

## 2019-07-07 ENCOUNTER — Ambulatory Visit (HOSPITAL_COMMUNITY): Payer: Medicaid Other | Admitting: *Deleted

## 2019-07-07 ENCOUNTER — Ambulatory Visit (HOSPITAL_COMMUNITY): Payer: Medicaid Other

## 2019-07-07 ENCOUNTER — Other Ambulatory Visit: Payer: Self-pay

## 2019-07-07 VITALS — BP 113/72 | HR 92 | Temp 97.8°F

## 2019-07-07 DIAGNOSIS — D259 Leiomyoma of uterus, unspecified: Secondary | ICD-10-CM | POA: Insufficient documentation

## 2019-07-07 DIAGNOSIS — Z362 Encounter for other antenatal screening follow-up: Secondary | ICD-10-CM | POA: Insufficient documentation

## 2019-07-07 DIAGNOSIS — Z3A33 33 weeks gestation of pregnancy: Secondary | ICD-10-CM

## 2019-07-07 DIAGNOSIS — O0933 Supervision of pregnancy with insufficient antenatal care, third trimester: Secondary | ICD-10-CM

## 2019-07-07 DIAGNOSIS — Z3402 Encounter for supervision of normal first pregnancy, second trimester: Secondary | ICD-10-CM | POA: Insufficient documentation

## 2019-07-07 DIAGNOSIS — O3410 Maternal care for benign tumor of corpus uteri, unspecified trimester: Secondary | ICD-10-CM | POA: Insufficient documentation

## 2019-07-07 DIAGNOSIS — O3412 Maternal care for benign tumor of corpus uteri, second trimester: Secondary | ICD-10-CM

## 2019-07-15 ENCOUNTER — Other Ambulatory Visit: Payer: Self-pay

## 2019-07-15 ENCOUNTER — Ambulatory Visit (INDEPENDENT_AMBULATORY_CARE_PROVIDER_SITE_OTHER): Payer: Medicaid Other | Admitting: Family Medicine

## 2019-07-15 VITALS — BP 115/70 | HR 109 | Wt 148.0 lb

## 2019-07-15 DIAGNOSIS — Z3402 Encounter for supervision of normal first pregnancy, second trimester: Secondary | ICD-10-CM

## 2019-07-15 DIAGNOSIS — O98413 Viral hepatitis complicating pregnancy, third trimester: Secondary | ICD-10-CM

## 2019-07-15 DIAGNOSIS — B1910 Unspecified viral hepatitis B without hepatic coma: Secondary | ICD-10-CM

## 2019-07-15 DIAGNOSIS — Z3A34 34 weeks gestation of pregnancy: Secondary | ICD-10-CM

## 2019-07-15 NOTE — Progress Notes (Signed)
   PRENATAL VISIT NOTE  Subjective:  Krystal Lloyd is a 29 y.o. G1P0 at [redacted]w[redacted]d being seen today for ongoing prenatal care.  She is currently monitored for the following issues for this high-risk pregnancy and has Encounter for supervision of normal first pregnancy in second trimester and Hepatitis B affecting pregnancy, antepartum on their problem list.  Patient reports no complaints.  Contractions: Not present. Vag. Bleeding: None.  Movement: Present. Denies leaking of fluid.   The following portions of the patient's history were reviewed and updated as appropriate: allergies, current medications, past family history, past medical history, past social history, past surgical history and problem list.   Objective:   Vitals:   07/15/19 1101  BP: 115/70  Pulse: (!) 109  Weight: 148 lb (67.1 kg)    Fetal Status: Fetal Heart Rate (bpm): 140 Fundal Height: 34 cm Movement: Present  Presentation: Vertex  General:  Alert, oriented and cooperative. Patient is in no acute distress.  Skin: Skin is warm and dry. No rash noted.   Cardiovascular: Normal heart rate noted  Respiratory: Normal respiratory effort, no problems with respiration noted  Abdomen: Soft, gravid, appropriate for gestational age.  Pain/Pressure: Present     Pelvic: Cervical exam deferred        Extremities: Normal range of motion.  Edema: None  Mental Status: Normal mood and affect. Normal behavior. Normal judgment and thought content.   Assessment and Plan:  Pregnancy: G1P0 at [redacted]w[redacted]d 1. Encounter for supervision of normal first pregnancy in second trimester FHT and FH normal  2. Hepatitis B affecting pregnancy, antepartum   Preterm labor symptoms and general obstetric precautions including but not limited to vaginal bleeding, contractions, leaking of fluid and fetal movement were reviewed in detail with the patient. Please refer to After Visit Summary for other counseling recommendations.   Return in about 2 weeks (around  07/29/2019) for OB f/u, GBS, In Office.  Future Appointments  Date Time Provider [place]  07/28/2019  3:45 PM Truett Mainland, DO [facility] None    Truett Mainland, DO

## 2019-07-28 ENCOUNTER — Encounter: Payer: Self-pay | Admitting: Family Medicine

## 2019-07-28 ENCOUNTER — Other Ambulatory Visit (HOSPITAL_COMMUNITY)
Admission: RE | Admit: 2019-07-28 | Discharge: 2019-07-28 | Disposition: A | Discharge status: Home / Self Care | Payer: Medicaid Other | Source: Ambulatory Visit | Attending: Family Medicine | Admitting: Family Medicine

## 2019-07-28 ENCOUNTER — Other Ambulatory Visit: Payer: Self-pay

## 2019-07-28 ENCOUNTER — Ambulatory Visit (INDEPENDENT_AMBULATORY_CARE_PROVIDER_SITE_OTHER): Payer: Medicaid Other | Admitting: Family Medicine

## 2019-07-28 VITALS — BP 113/69 | HR 97 | Wt 151.0 lb

## 2019-07-28 DIAGNOSIS — B1910 Unspecified viral hepatitis B without hepatic coma: Secondary | ICD-10-CM

## 2019-07-28 DIAGNOSIS — Z3402 Encounter for supervision of normal first pregnancy, second trimester: Secondary | ICD-10-CM

## 2019-07-28 DIAGNOSIS — O98419 Viral hepatitis complicating pregnancy, unspecified trimester: Secondary | ICD-10-CM

## 2019-07-28 NOTE — Progress Notes (Signed)
   PRENATAL VISIT NOTE  Subjective:  Krystal Lloyd is a 29 y.o. G1P0 at [redacted]w[redacted]d being seen today for ongoing prenatal care.  She is currently monitored for the following issues for this high-risk pregnancy and has Encounter for supervision of normal first pregnancy in second trimester and Hepatitis B affecting pregnancy, antepartum on their problem list.  Patient reports occasional contractions.  Contractions: Not present. Vag. Bleeding: None.  Movement: Present. Denies leaking of fluid.   The following portions of the patient's history were reviewed and updated as appropriate: allergies, current medications, past family history, past medical history, past social history, past surgical history and problem list.   Objective:   Vitals:   07/28/19 1552  BP: 113/69  Pulse: 97  Weight: 151 lb (68.5 kg)    Fetal Status: Fetal Heart Rate (bpm): 148 Fundal Height: 35 cm Movement: Present  Presentation: Vertex  General:  Alert, oriented and cooperative. Patient is in no acute distress.  Skin: Skin is warm and dry. No rash noted.   Cardiovascular: Normal heart rate noted  Respiratory: Normal respiratory effort, no problems with respiration noted  Abdomen: Soft, gravid, appropriate for gestational age.  Pain/Pressure: Present     Pelvic: Cervical exam performed in the presence of a chaperone Dilation: 1 Effacement (%): 30 Station: Ballotable  Extremities: Normal range of motion.  Edema: None  Mental Status: Normal mood and affect. Normal behavior. Normal judgment and thought content.   Assessment and Plan:  Pregnancy: G1P0 at [redacted]w[redacted]d 1. Encounter for supervision of normal first pregnancy in second trimester FHT and FH normal - Culture, beta strep (group b only) - GC/Chlamydia probe amp ([place])not at [facility]  2. Hepatitis B affecting pregnancy, antepartum  Preterm labor symptoms and general obstetric precautions including but not limited to vaginal bleeding, contractions, leaking of fluid and  fetal movement were reviewed in detail with the patient. Please refer to After Visit Summary for other counseling recommendations.   Return in about 1 week (around 08/04/2019).  Future Appointments  Date Time Provider [place]  08/05/2019  8:45 AM Anyanwu, Sallyanne Havers, MD [facility] None  08/11/2019 10:00 AM Lavonia Drafts, MD [facility] None  08/18/2019  9:30 AM Truett Mainland, DO [facility] None    Truett Mainland, DO

## 2019-08-01 LAB — GC/CHLAMYDIA PROBE AMP (~~LOC~~) NOT AT ARMC
Chlamydia: NEGATIVE
Comment: NEGATIVE
Comment: NORMAL
Neisseria Gonorrhea: NEGATIVE

## 2019-08-01 LAB — CULTURE, BETA STREP (GROUP B ONLY): Strep Gp B Culture: NEGATIVE

## 2019-08-05 ENCOUNTER — Ambulatory Visit (INDEPENDENT_AMBULATORY_CARE_PROVIDER_SITE_OTHER): Payer: Medicaid Other | Admitting: Obstetrics & Gynecology

## 2019-08-05 ENCOUNTER — Encounter: Payer: Self-pay | Admitting: Obstetrics & Gynecology

## 2019-08-05 ENCOUNTER — Other Ambulatory Visit: Payer: Self-pay

## 2019-08-05 VITALS — BP 100/62 | HR 87 | Wt 151.1 lb

## 2019-08-05 DIAGNOSIS — Z3403 Encounter for supervision of normal first pregnancy, third trimester: Secondary | ICD-10-CM

## 2019-08-05 DIAGNOSIS — B1910 Unspecified viral hepatitis B without hepatic coma: Secondary | ICD-10-CM

## 2019-08-05 DIAGNOSIS — O98419 Viral hepatitis complicating pregnancy, unspecified trimester: Secondary | ICD-10-CM

## 2019-08-05 NOTE — Progress Notes (Signed)
   PRENATAL VISIT NOTE  Subjective:  Krystal Lloyd is a 29 y.o. G1P0 at [redacted]w[redacted]d being seen today for ongoing prenatal care.  She is currently monitored for the following issues for this low-risk pregnancy and has Encounter for supervision of normal first pregnancy in third trimester and Hepatitis B affecting pregnancy, antepartum on their problem list.  Patient reports no complaints.  Contractions: Irregular. Vag. Bleeding: None.  Movement: Present. Denies leaking of fluid.   The following portions of the patient's history were reviewed and updated as appropriate: allergies, current medications, past family history, past medical history, past social history, past surgical history and problem list.   Objective:   Vitals:   08/05/19 0848  BP: 100/62  Pulse: 87  Weight: 151 lb 1.9 oz (68.5 kg)    Fetal Status: Fetal Heart Rate (bpm): 146 Fundal Height: 36 cm Movement: Present     General:  Alert, oriented and cooperative. Patient is in no acute distress.  Skin: Skin is warm and dry. No rash noted.   Cardiovascular: Normal heart rate noted  Respiratory: Normal respiratory effort, no problems with respiration noted  Abdomen: Soft, gravid, appropriate for gestational age.  Pain/Pressure: Absent     Pelvic: Cervical exam deferred        Extremities: Normal range of motion.  Edema: Trace  Mental Status: Normal mood and affect. Normal behavior. Normal judgment and thought content.   Assessment and Plan:  Pregnancy: G1P0 at [redacted]w[redacted]d 1. Hepatitis B affecting pregnancy, antepartum Will recheck labs today, last check was three months ago. Will follow up results and manage accordingly. - Hepatitis B DNA, ultraquantitative, PCR - Comprehensive metabolic panel  2. Encounter for supervision of normal first pregnancy in third trimester Negative pelvic cultures from last week.  Labor symptoms and general obstetric precautions including but not limited to vaginal bleeding, contractions, leaking of fluid and  fetal movement were reviewed in detail with the patient. Please refer to After Visit Summary for other counseling recommendations.   Return in about 1 week (around 08/12/2019) for OFFICE OB Visit.  Future Appointments  Date Time Provider [place]  08/11/2019 10:00 AM Lavonia Drafts, MD [facility] None  08/18/2019  9:30 AM Truett Mainland, DO [facility] None    Verita Schneiders, MD

## 2019-08-05 NOTE — Patient Instructions (Addendum)
Return to office for any scheduled appointments. Call the office or go to the MAU at [facility] if:  You begin to have strong, frequent contractions  Your water breaks.  Sometimes it is a big gush of fluid, sometimes it is just a trickle that keeps getting your panties wet or running down your legs  You have vaginal bleeding.  It is normal to have a small amount of spotting if your cervix was checked.   You do not feel your baby moving like normal.  If you do not, get something to eat and drink and lay down and focus on feeling your baby move.   If your baby is still not moving like normal, you should call the office or go to MAU.  Any other obstetric concerns.   AREA PEDIATRIC/FAMILY PRACTICE PHYSICIANS  [place] ([address]) . [facility] o Chambliss, MD; Eniola, MD; Hale, MD; Hensel, MD; McDiarmid, MD; McIntyer, MD; Neal, MD; Walden, MD o [address] [place], [place] [address] o [phone] o Mon-Fri 8:30-12:30, 1:30-5:00 o Providers come to see babies at [facility] o Accepting Medicaid . [facility] o Limited providers who accept newborns: Koirala, MD; Morrow, MD; Wolters, MD o [address], [place], [place] [address] o [phone] o Mon-Fri 8:00-5:30 o Babies seen by providers at [facility] o Does NOT accept Medicaid o Please call early in hospitalization for appointment (limited availability)  . [facility] o Mulberry, MD o [address] [place], [place] [address] o [phone] o Mon, Tue, Thur, Fri 8:30-5:00, Wed 10:00-7:00 (closed 1-2pm) o Babies seen by [facility] providers o Accepting Medicaid . [facility] o Rubin, MD o [address], [place], [place] [address] o [phone] o Mon-Fri 8:30-5:00, Sat 8:30-12:00 o Provider comes to see babies at [facility] o Accepting  Medicaid o Must have been referred from current patients or contacted office prior to delivery . [facility] ([facility]) o Brown, MD; Chandler, MD; Ettefagh, MD; Grant, MD; Lester, MD; McCormick, MD; McQueen, MD; Prose, MD; Simha, MD; Stanley, MD; Stryffeler, NP; Tebben, NP o [address], [place], [place] [address] o [phone] o Mon, Tue, Thur, Fri 8:30-5:30, Wed 9:30-5:30, Sat 8:30-12:30 o Babies seen by [facility] providers o Accepting Medicaid o Only accepting infants of first-time parents or siblings of current patients o Hospital discharge coordinator will make follow-up appointment . Jack Amos o [address], [place], [place]  [address] o [phone]   Fax - [phone] . [facility] o [address], [place], [place]  [address] o Phone - [phone]   Fax - [phone] . Shilpa Gosrani o [address], [place], [place]  [address] o [phone]  [place] ([address]) . [facility] o Bates, MD; Brassfield, MD; Cooper, Cox, MD; MD; Davis, MD; Dovico, MD; Ettefaugh, MD; Little, MD; Lowe, MD; Keiffer, MD; Melvin, MD; Sumner, MD; Williams, MD o [address], [place], [place] [address] o [phone] o Mon-Fri 8:30-5:00 (extended evenings Mon-Thur as needed), Sat-Sun 10:00-1:00 o Providers come to see babies at [facility] o Accepting Medicaid for families of first-time babies and families with all children in the household age 3 and under. Must register with office prior to making appointment (M-F only). . [facility] o Henson, NP; Knapp, MD; Lalonde, MD;   Tysinger, PA o [address] [place], [place] [address] o [phone] o Mon-Fri 8:00-5:00 o Babies seen by providers at [facility] o Does NOT accept Medicaid/Commercial Insurance Only . [facility] ([facility]  o Artis, MD; Barnes, MD; Bratton, MD; Coccaro, MD; Lockett Gardner, MD; Kramer, MD; Marshall, MD; Netherton, MD; Poleto, MD; Skinner, MD o [address] [place], [place] [address] o [phone] o Mon-Fri 8:30-5:30, Sat (Oct.-Mar.) 9:00-1:00 o Babies seen by providers at [facility] o Accepting Medicaid  [place] ([address]) . [facility] o Reid, MD; Warner, MD o [address], [place], [place] [address] o [phone] o Mon-Fri 8:30-5:00, Sat 8:30-12:00 o Providers come to see babies at [facility] o Does NOT accept Medicaid . [facility] o Becker, PA; Hagler, MD; Scifres, PA; Sun, MD; Swayne, MD o [address], [place], [place] [address] o [phone] o Mon-Fri 8:00-5:00 o Babies seen by providers at [facility] o Does NOT accept Medicaid o Only accepting babies of parents who are patients o Please call early in hospitalization for appointment (limited availability) . [facility] o Clark, MD; Frye, MD; Kelleher, MD; Mack, NP; Miller, MD; O'Keller, MD; Patterson, NP; Pudlo, MD; Puzio, MD; Thomas, MD; Tucker, MD; Twiselton, MD o [address], [place], [place] [address] o [phone] o Mon-Fri 8:00-5:00, Sat 9:00-12:00 o Providers come to see babies at [facility] o Does NOT accept Medicaid  [place] ([address]) . [facility] o Limited providers accepting new patients: Brake, NP; Wharton, PA o [address], [place], [place] [address] o [phone] o Mon-Fri 8:00-5:00 o Babies seen by providers at [facility] o Does NOT accept Medicaid o Only accepting babies of parents who are patients o Please call early in hospitalization for appointment (limited availability) . [facility] o Gay, MD; Quinlan, MD o [address] [place], [place] [address] o [phone] (press 1 to schedule  appointment) o Mon-Fri 8:00-5:00 o Providers come to see babies at [facility] o Does NOT accept Medicaid . [facility] o Mazer, MD o [address] [place], [place] [address] o [phone] o Mon-Fri 8:30-5:00 (lunch 12:30-1:00), extended hours by appointment only Wed 5:00-6:30 o Babies seen by [facility] providers o Accepting Medicaid . [facility] o Banks, MD; Jordan, MD; Koberlein, MD o [address], [place], [place] [address] o [phone] o Mon-Fri 8:00-5:00 o Babies seen by [facility] providers o Does NOT accept Medicaid . [facility] o Parker, MD; Hunter, MD; Wallace, DO o [address] [place], [place] [address] o [phone] o Mon-Fri 8:00-5:00 o Babies seen by [facility] providers o Does NOT accept Medicaid . [facility] o Brandon, PA; Brecken, PA; Christy, NP; Dees, MD; DeClaire, MD; DeWeese, MD; Hansen, NP; Mills, NP; Parrish, NP; Smoot, NP; Summer, MD; Vapne, MD o [address] [place], [place] [address] o [phone] o Mon-Fri 8:30-5:00, Sat 10:00-1:00 o Providers come to see babies at [facility] o Does NOT accept Medicaid o Free prenatal information session Tuesdays at 4:45pm . [facility] o Bouska, MD; Gordon, PA; Jeffery, PA; Weber, PA o [address] [place] [address] o [phone] o Mon-Fri 7:30-5:30 o Babies seen by [facility] providers . [facility] o [address], [place],   [address] o [phone]   Fax -   [phone]  [place] ([address]) . [facility] o Reese, MD o [address] [place], [place] [address] o [phone] o Mon-Thur 8:00-6:00 o Providers come to see babies at [facility] o Accepting Medicaid . [facility] o Anderson, NP; Badger, MD; Beal, PA; Spencer, PA o [address] [place], [place] [address] o [phone] o Mon-Thur 7:30-7:30, Fri 7:30-4:30 o Babies seen by [facility] providers o Accepting Medicaid . [facility] o Agbuya, MD; Klett, NP; Romgoolam, MD o [address], [place], [place] [address] o [phone] o Mon-Fri 8:30-5:00, Sat 8:30-12:00 o Providers come to see babies at [facility] o Accepting Medicaid o Must have "Meet & Greet" appointment at office prior to delivery . [facility] ([facility] o McCord, MD; Wallace, MD; Wood, MD o [address], [place], [place] [address] o [phone] o Mon-Wed 8:00-6:00, Thur-Fri 8:00-5:00, Sat 9:00-12:00 o Providers come to see babies at [facility] o Does NOT accept Medicaid o Only accepting siblings of current patients . [facility]  o [address], [place], [place]  [address] o [phone]   Fax - [phone] . [facility] o [address], [place], [place]  [address] o [phone]   Fax - [phone]  [place] ([address]) . [facility] o Cirigliano, DO; Matthews, DO o [address] [place], [place] [address] o [phone] o Mon-Fri 7:00-5:00 o Babies seen by [facility] providers o Does NOT accept Medicaid . [facility] o Briscoe, MD; Howley, PA; Moreira, PA o [address], [place], [place] [address] o [phone] o Mon-Fri 8:00-5:00 o Babies seen by [facility] providers o Accepting Medicaid . [facility] o Boyd, MD; Church, PA; Jones, NP; Osborn, PA o [address], , [place] [address] o [phone] o Mon-Fri 8:00-5:00 o Babies seen by providers at [facility] o Accepting Medicaid  [place] ([address]) . [facility] [place] o [place], DO o [address] [place], [place] [address] o [phone] o Mon-Fri 8:00-5:00 o Babies seen by [facility] providers o Does NOT accept Medicaid o Limited availability, please call early in hospitalization to schedule follow-up . [facility] o Calderon, PA; Cummings, MD; Dillard, MD; Martin, PA; Olson, MD; VanDeven, PA o [address], [place], [place] [address] o [phone] o Mon-Fri 8:30-5:00, Sat 9:00-12:00 o Babies seen by providers at [facility] o Accepting Medicaid o Please register online then schedule online or call office o [email or web address] . [facility] ([facility] o Hunter, NP; Kumar, MD; Martin Rogers, PA o [address], [place], [place] [address] o [phone] o Mon-Fri 8:00-5:00 o Babies seen by providers at [facility] o Accepting Medicaid . [facility] ([facility] o Ivanhoe, MD; Kristi Fleenor, NP; West, MD o [address], [place], [place] [address] o [phone] o Mon-Fri 8:00-5:30, Sat&Sun by appointment (phones open at 8:30) o Babies seen by [facility] providers o Accepting Medicaid o Must be a first-time baby or sibling of current patient . [facility] [place]  o [address], [place], [place]  [address] o [phone]   Fax - [phone]    [place] ([address]) . [facility] o Brown, PA; Cowen, PA; Rice, MD; Helton, PA; Spry, MD o [address] [place], [place] [address] o [phone] o Mon-Thur 8:00-7:00, Fri 8:00-5:00, Sat 8:00-12:00, Sun 9:00-12:00 o Babies seen by [facility] providers o Accepting Medicaid . [facility] o Coe-Goins, MD; Marshall, MD; Pierre-Louis, MD o [address] [place] [address], [place], [place]  [address] o [phone] o Mon-Thur 8:00-5:00 o Babies seen by providers at [facility] o Accepting Medicaid . [facility] o Bratton, MD; Coe-Goins, MD; Hayes, MD; Lewis, MD; List, MD; Lott, MD; Marshall, MD; Moran, MD; O'Neal, MD; Pierre-Louis, MD; Pitonzo, MD; Scholer, MD; Spangle, MD o [address] [place], [place] [address] o [phone] o Mon-Fri 8:00-5:30, Sat (Oct.-Mar.) 9:00-1:00 o Babies seen by providers at [facility] o Accepting Medicaid o Must fill out new patient packet, available online at [email or web address] . [facility] ([facility] o Friddle, NP; Harris, NP; Kelly, NP; Logan, MD; Melvin, PA; Poth, MD; Ramadoss, MD; Stanton, NP o [address], [place], [place] [address] o [phone] o Mon-Thur 8:00-5:30, Fri 8:00-5:00 o Babies seen by providers at [facility] o Accepting Medicaid  [place] ([address]) . [facility] o Dixon, PA; Botines, MD; Pickard, MD; Tapia, PA o [address], [place], [place] [address] o [phone] o Mon-Fri 8:00-5:00 o Babies seen by providers at [facility] o Accepting Medicaid   [place] ([address]) . [facility] [place] o Masneri, DO; Meyers, MD; Nelson, PA o [address], [place], [place] [address] o [phone] o Mon-Fri 8:00-5:00 o Babies seen by providers at [facility] o Does NOT accept Medicaid o Limited appointment availability, please call early in hospitalization  . [facility] [place] o Kunedd, DO; McGowen, MD o [address], [place], [place] [address] o [phone] o Mon-Fri 8:00-5:00 o Babies seen by [facility] providers o Does NOT accept Medicaid . [facility] [place] o Cameron, MD; MacDonald, MD; Michaels, PA; Nayak, MD o [address] [place] [address], [place], [place]  [address] o [phone] o Mon-Fri 8:00-5:00 o After hours clinic ([address] [place], [place] [address]) [phone] Mon-Fri 5:00-8:00, Sat 12:00-6:00, Sun 10:00-4:00 o Babies seen by [facility] providers o Accepting Medicaid . [facility] [place] o [address], [place], [place]  [address] o [phone]   Fax - [phone]  [place] ([address]) . [facility] [place] [facility] o Andy, MD o [address], [place], [place] [address] o [phone] o Mon-Fri 8:00-5:00 o Babies seen by [facility] providers o Does NOT accept Medicaid . [facility] [place] ([facility] [place]) o Eksir, MD o [address], [place], [place] [address] o [phone] o Mon-Thur 8:00-7:00, Fri 8:00-5:00, Sat 8:00-12:00 o Babies seen by providers at [facility] o Accepting Medicaid - but does not have vaccinations in office (must be received elsewhere) o Limited availability, please call early in hospitalization  [place] ([address]) . [facility]  o Charlene Flemming, MD o [address], [place] [address] o [phone]  Fax [phone]     

## 2019-08-06 LAB — COMPREHENSIVE METABOLIC PANEL
ALT: 11 IU/L (ref 0–32)
AST: 16 IU/L (ref 0–40)
Albumin/Globulin Ratio: 1.2 (ref 1.2–2.2)
Albumin: 3.9 g/dL (ref 3.9–5.0)
Alkaline Phosphatase: 163 IU/L — ABNORMAL HIGH (ref 39–117)
BUN/Creatinine Ratio: 18 (ref 9–23)
BUN: 7 mg/dL (ref 6–20)
Bilirubin Total: <0.2 mg/dL (ref 0.0–1.2)
CO2: 19 mmol/L — ABNORMAL LOW (ref 20–29)
Calcium: 9.2 mg/dL (ref 8.7–10.2)
Chloride: 103 mmol/L (ref 96–106)
Creatinine, Ser: 0.39 mg/dL — ABNORMAL LOW (ref 0.57–1.00)
GFR calc Af Amer: 166 mL/min/{1.73_m2} (ref >59)
GFR calc non Af Amer: 144 mL/min/{1.73_m2} (ref >59)
Globulin, Total: 3.2 g/dL (ref 1.5–4.5)
Glucose: 106 mg/dL — ABNORMAL HIGH (ref 65–99)
Potassium: 3.8 mmol/L (ref 3.5–5.2)
Sodium: 137 mmol/L (ref 134–144)
Total Protein: 7.1 g/dL (ref 6.0–8.5)

## 2019-08-06 LAB — HEPATITIS B DNA, ULTRAQUANTITATIVE, PCR
HBV DNA SERPL PCR-ACNC: 150 IU/mL
HBV DNA SERPL PCR-LOG IU: 2.176 log10 IU/mL

## 2019-08-11 ENCOUNTER — Other Ambulatory Visit: Payer: Self-pay

## 2019-08-11 ENCOUNTER — Ambulatory Visit (INDEPENDENT_AMBULATORY_CARE_PROVIDER_SITE_OTHER): Payer: Medicaid Other | Admitting: Obstetrics & Gynecology

## 2019-08-11 VITALS — BP 111/66 | HR 89 | Wt 153.0 lb

## 2019-08-11 DIAGNOSIS — B1910 Unspecified viral hepatitis B without hepatic coma: Secondary | ICD-10-CM

## 2019-08-11 DIAGNOSIS — O98419 Viral hepatitis complicating pregnancy, unspecified trimester: Secondary | ICD-10-CM

## 2019-08-11 DIAGNOSIS — Z3403 Encounter for supervision of normal first pregnancy, third trimester: Secondary | ICD-10-CM

## 2019-08-11 NOTE — Progress Notes (Signed)
   PRENATAL VISIT NOTE  Subjective:  Krystal Lloyd is a 29 y.o. G1P0 at [redacted]w[redacted]d being seen today for ongoing prenatal care.  She is currently monitored for the following issues for this low-risk pregnancy and has Encounter for supervision of normal first pregnancy in third trimester and Hepatitis B affecting pregnancy, antepartum on their problem list.  Patient reports no complaints.  Contractions: Irritability. Vag. Bleeding: None.  Movement: Present. Denies leaking of fluid.   The following portions of the patient's history were reviewed and updated as appropriate: allergies, current medications, past family history, past medical history, past social history, past surgical history and problem list.   Objective:   Vitals:   08/11/19 1018  BP: 111/66  Pulse: 89  Weight: 153 lb (69.4 kg)    Fetal Status: Fetal Heart Rate (bpm): 129   Movement: Present     General:  Alert, oriented and cooperative. Patient is in no acute distress.  Skin: Skin is warm and dry. No rash noted.   Cardiovascular: Normal heart rate noted  Respiratory: Normal respiratory effort, no problems with respiration noted  Abdomen: Soft, gravid, appropriate for gestational age.  Pain/Pressure: Absent     Pelvic: Cervical exam deferred        Extremities: Normal range of motion.  Edema: None  Mental Status: Normal mood and affect. Normal behavior. Normal judgment and thought content.   Assessment and Plan:  Pregnancy: G1P0 at [redacted]w[redacted]d 1. Encounter for supervision of normal first pregnancy in third trimester FH and FHR WNL   2. Hepatitis B affecting pregnancy, antepartum Labs slightly elevated.   Term labor symptoms and general obstetric precautions including but not limited to vaginal bleeding, contractions, leaking of fluid and fetal movement were reviewed in detail with the patient. Please refer to After Visit Summary for other counseling recommendations.   Return in about 1 week (around 08/18/2019).  Future  Appointments  Date Time Provider [place]  08/18/2019  9:30 AM Truett Mainland, DO [facility] None    Lavonia Drafts, MD

## 2019-08-15 ENCOUNTER — Encounter (HOSPITAL_COMMUNITY): Payer: Self-pay | Admitting: Obstetrics and Gynecology

## 2019-08-15 ENCOUNTER — Other Ambulatory Visit: Payer: Self-pay

## 2019-08-15 ENCOUNTER — Inpatient Hospital Stay (EMERGENCY_DEPARTMENT_HOSPITAL)
Admission: AD | Admit: 2019-08-15 | Discharge: 2019-08-15 | Disposition: A | Discharge status: Home / Self Care | Payer: Medicaid Other | Source: Home / Self Care | Attending: Obstetrics and Gynecology | Admitting: Obstetrics and Gynecology

## 2019-08-15 DIAGNOSIS — O479 False labor, unspecified: Secondary | ICD-10-CM | POA: Diagnosis not present

## 2019-08-15 DIAGNOSIS — Z3A38 38 weeks gestation of pregnancy: Secondary | ICD-10-CM | POA: Insufficient documentation

## 2019-08-15 DIAGNOSIS — Z3403 Encounter for supervision of normal first pregnancy, third trimester: Secondary | ICD-10-CM

## 2019-08-15 DIAGNOSIS — O471 False labor at or after 37 completed weeks of gestation: Secondary | ICD-10-CM | POA: Insufficient documentation

## 2019-08-15 HISTORY — DX: Other specified health status: Z78.9

## 2019-08-15 NOTE — MAU Provider Note (Signed)
None    S: Ms. Krystal Lloyd is a 29 y.o. G1P0 at [redacted]w[redacted]d  who presents to MAU today complaining contractions q 10 minutes. She endorses bloody show. She denies LOF. She reports normal fetal movement.    O: BP 123/81   Pulse 83   Temp 98.1 F (36.7 C)   Resp 16   Ht 5\' 1"  (1.549 m)   Wt 70.8 kg   LMP 11/16/2018 (Exact Date)   BMI 29.48 kg/m  GENERAL: Well-developed, well-nourished female in no acute distress.  HEAD: Normocephalic, atraumatic.  CHEST: Normal effort of breathing, regular heart rate ABDOMEN: Soft, nontender, gravid  Cervical exam:  Dilation: 1 Effacement (%): 80 Cervical Position: Middle Station: Ballotable Presentation: Vertex Exam by:: K.Wilson,RN   Fetal Monitoring: Baseline: 135 Variability: Mod Accelerations: POS 15 x 15 Decelerations: None  Contractions: Irregular, q 3-7   A: SIUP at [redacted]w[redacted]d  Remains 1cm after 1 hour observation in MAU  P: Discharge home in stable condition  Mallie Snooks, CNM 08/15/2019 3:26 PM

## 2019-08-15 NOTE — MAU Note (Signed)
Pt stated she is having bloody show when she wipes and has had ctx 10-10 min apart. Good fetal movement fetl

## 2019-08-15 NOTE — Discharge Instructions (Signed)
First Stage of Labor Labor is your body's natural process of moving your baby and other structures, including the placenta and umbilical cord, out of your uterus. There are three stages of labor. How long each stage lasts is different for every woman. But certain events happen during each stage that are the same for everyone.  The first stage starts when true labor begins. This stage ends when your cervix, which is the opening from your uterus into your vagina, is completely open (dilated).  The second stage begins when your cervix is fully dilated and you start pushing. This stage ends when your baby is born.  The third stage is the delivery of the organ that nourished your baby during pregnancy (placenta). First stage of labor As your due date gets closer, you may start to notice certain physical changes that mean labor is going to start soon. You may feel that your baby has dropped lower into your pelvis. You may experience irregular, often painless, contractions that go away when you walk around or lie down ([employer] contractions). This is also called false labor. The first stage of labor begins when you start having contractions that come at regular (evenly spaced) intervals and your cervix starts to get thinner and wider in preparation for your baby to pass through. Birth care providers measure the dilation of your cervix in centimeters (cm). One centimeter is a little less than one-half of an inch. The first stage ends when your cervix is dilated to 10 cm. The first stage of labor is divided into three phases:  Early phase.  Active phase.  Transitional phase. The length of the first stage of labor varies. It may be longer if this is your first pregnancy. You may spend most of this stage at home trying to relax and stay comfortable. How does this affect me? During the first stage of labor, you will move through three phases. What happens in the early phase?  You will start to have  regular contractions that last 30-60 seconds. Contractions may come every 5-20 minutes. Keep track of your contractions and call your birth care provider.  Your water may break during this phase.  You may notice a clear or slightly bloody discharge of mucus (mucus plug) from your vagina.  Your cervix will dilate to 3-6 cm. What happens in the active phase? The active phase usually lasts 3-5 hours. You may go to the hospital or birth center around this time. During the active phase:  Your contractions will become stronger, longer, and more uncomfortable.  Your contractions may last 45-90 seconds and come every 3-5 minutes.  You may feel lower back pain.  Your birth care providers may examine your cervix and feel your belly to find the position of your baby.  You may have a monitor strapped to your belly to measure your contractions and your baby's heart rate.  You may start using your pain management options.  Your cervix may be dilated to 6 cm and may start to dilate more quickly. What happens in the transitional phase? The transitional phase typically lasts from 30 minutes to 2 hours. At the end of this phase, your cervix will be fully dilated to 10 cm. During the transitional phase:  Contractions will get stronger and longer.  Contractions may last 60-90 seconds and come less than 2 minutes apart.  You may feel hot flashes, chills, or nausea. How does this affect my baby? During the first stage of labor, your baby will  gradually move down into your birth canal. °Follow these instructions at home and in the hospital or birth center: ° °· When labor first begins, try to stay calm. You are still in the early phase. If it is night, try to get some sleep. If it is day, try to relax and save your energy. You may want to make some calls and get ready to go to the hospital or birth center. °· When you are in the early phase, try these methods to help ease discomfort: °? Deep breathing and  muscle relaxation. °? Taking a walk. °? Taking a warm bath or shower. °· Drink some fluids and have a light snack if you feel like it. °· Keep track of your contractions. °· Based on the plan you created with your birth care provider, call when your contractions indicate it is time. °· If your water breaks, note the time, color, and odor of the fluid. °· When you are in the active phase, do your breathing exercises and rely on your support people and your team of birth care providers. °Contact a health care provider if: °· Your contractions are strong and regular. °· You have lower back pain or cramping. °· Your water breaks. °· You lose your mucus plug. °Get help right away if you: °· Have a severe headache that does not go away. °· Have changes in your vision. °· Have severe pain in your upper belly. °· Do not feel the baby move. °· Have bright red bleeding. °Summary °· The first stage of labor starts when true labor begins, and it ends when your cervix is dilated to 10 cm. °· The first stage of labor has three phases: early, active, and transitional. °· Your baby moves into the birth canal during the first stage of labor. °· You may have contractions that become stronger and longer. You may also lose your mucus plug and have your water break. °· Call your birth care provider when your contractions are frequent and strong enough to go to the hospital or birth center. °This information is not intended to replace advice given to you by your health care provider. Make sure you discuss any questions you have with your health care provider. °Document Revised: 07/08/2018 Document Reviewed: 05/31/2017 °Elsevier Patient Education © 2020 [employer] ° °

## 2019-08-16 ENCOUNTER — Inpatient Hospital Stay (HOSPITAL_COMMUNITY)
Admission: AD | Admit: 2019-08-16 | Discharge: 2019-08-18 | DRG: 797 | Disposition: A | Discharge status: Home / Self Care | Payer: Medicaid Other | Attending: Family Medicine | Admitting: Family Medicine

## 2019-08-16 ENCOUNTER — Inpatient Hospital Stay (HOSPITAL_COMMUNITY): Payer: Medicaid Other | Admitting: Anesthesiology

## 2019-08-16 ENCOUNTER — Encounter (HOSPITAL_COMMUNITY): Payer: Self-pay | Admitting: Obstetrics and Gynecology

## 2019-08-16 DIAGNOSIS — O9842 Viral hepatitis complicating childbirth: Secondary | ICD-10-CM | POA: Diagnosis present

## 2019-08-16 DIAGNOSIS — O43123 Velamentous insertion of umbilical cord, third trimester: Secondary | ICD-10-CM | POA: Diagnosis present

## 2019-08-16 DIAGNOSIS — B1910 Unspecified viral hepatitis B without hepatic coma: Secondary | ICD-10-CM | POA: Diagnosis present

## 2019-08-16 DIAGNOSIS — Z3A39 39 weeks gestation of pregnancy: Secondary | ICD-10-CM

## 2019-08-16 DIAGNOSIS — O26893 Other specified pregnancy related conditions, third trimester: Secondary | ICD-10-CM | POA: Diagnosis present

## 2019-08-16 DIAGNOSIS — Z3403 Encounter for supervision of normal first pregnancy, third trimester: Secondary | ICD-10-CM

## 2019-08-16 DIAGNOSIS — Z20822 Contact with and (suspected) exposure to covid-19: Secondary | ICD-10-CM | POA: Diagnosis present

## 2019-08-16 DIAGNOSIS — B181 Chronic viral hepatitis B without delta-agent: Secondary | ICD-10-CM | POA: Diagnosis present

## 2019-08-16 HISTORY — DX: Unspecified viral hepatitis B without hepatic coma: B19.10

## 2019-08-16 LAB — CBC
HCT: 39.5 % (ref 36.0–46.0)
Hemoglobin: 13.2 g/dL (ref 12.0–15.0)
MCH: 29.9 pg (ref 26.0–34.0)
MCHC: 33.4 g/dL (ref 30.0–36.0)
MCV: 89.4 fL (ref 80.0–100.0)
Platelets: 180 10*3/uL (ref 150–400)
RBC: 4.42 MIL/uL (ref 3.87–5.11)
RDW: 12.8 % (ref 11.5–15.5)
WBC: 10.4 10*3/uL (ref 4.0–10.5)
nRBC: 0 % (ref 0.0–0.2)

## 2019-08-16 LAB — COMPREHENSIVE METABOLIC PANEL
ALT: 15 U/L (ref 0–44)
AST: 19 U/L (ref 15–41)
Albumin: 3.1 g/dL — ABNORMAL LOW (ref 3.5–5.0)
Alkaline Phosphatase: 147 U/L — ABNORMAL HIGH (ref 38–126)
Anion gap: 14 (ref 5–15)
BUN: 6 mg/dL (ref 6–20)
CO2: 20 mmol/L — ABNORMAL LOW (ref 22–32)
Calcium: 9.1 mg/dL (ref 8.9–10.3)
Chloride: 102 mmol/L (ref 98–111)
Creatinine, Ser: 0.48 mg/dL (ref 0.44–1.00)
GFR calc Af Amer: >60 mL/min (ref >60)
GFR calc non Af Amer: >60 mL/min (ref >60)
Glucose, Bld: 93 mg/dL (ref 70–99)
Potassium: 3.7 mmol/L (ref 3.5–5.1)
Sodium: 136 mmol/L (ref 135–145)
Total Bilirubin: 0.7 mg/dL (ref 0.3–1.2)
Total Protein: 7.1 g/dL (ref 6.5–8.1)

## 2019-08-16 LAB — SARS CORONAVIRUS 2 BY RT PCR (HOSPITAL ORDER, PERFORMED IN ~~LOC~~ HOSPITAL LAB): SARS Coronavirus 2: NEGATIVE

## 2019-08-16 LAB — TYPE AND SCREEN
ABO/RH(D): B POS
Antibody Screen: NEGATIVE

## 2019-08-16 LAB — ABO/RH: ABO/RH(D): B POS

## 2019-08-16 MED ORDER — FLEET ENEMA 7-19 GM/118ML RE ENEM: 1.0000 | ENEMA | Freq: Every day | RECTAL | Status: DC | PRN

## 2019-08-16 MED ORDER — SODIUM CHLORIDE 0.9 % IV SOLN: 250.0000 mL | INTRAVENOUS | Status: DC | PRN

## 2019-08-16 MED ORDER — IBUPROFEN 600 MG PO TABS
600.0000 mg | ORAL_TABLET | Freq: Four times a day (QID) | ORAL | Status: DC
  Administered 2019-08-16 – 2019-08-18 (×6): 600 mg via ORAL
  Filled 2019-08-16 (×6): qty 1

## 2019-08-16 MED ORDER — LACTATED RINGERS IV SOLN: 500.0000 mL | Freq: Once | INTRAVENOUS | Status: DC

## 2019-08-16 MED ORDER — FENTANYL-BUPIVACAINE-NACL 0.5-0.125-0.9 MG/250ML-% EP SOLN
12.0000 mL/h | EPIDURAL | Status: DC | PRN
  Filled 2019-08-16: qty 250

## 2019-08-16 MED ORDER — ACETAMINOPHEN 325 MG PO TABS: 650.0000 mg | ORAL_TABLET | ORAL | Status: DC | PRN

## 2019-08-16 MED ORDER — ACETAMINOPHEN 325 MG PO TABS
650.0000 mg | ORAL_TABLET | ORAL | Status: DC | PRN
  Administered 2019-08-18: 650 mg via ORAL
  Filled 2019-08-16: qty 2

## 2019-08-16 MED ORDER — SODIUM CHLORIDE 0.9% FLUSH: 3.0000 mL | INTRAVENOUS | Status: DC | PRN

## 2019-08-16 MED ORDER — OXYCODONE-ACETAMINOPHEN 5-325 MG PO TABS: 1.0000 | ORAL_TABLET | ORAL | Status: DC | PRN

## 2019-08-16 MED ORDER — MISOPROSTOL 200 MCG PO TABS
400.0000 ug | ORAL_TABLET | Freq: Once | ORAL | Status: AC
  Administered 2019-08-16: 400 ug via RECTAL

## 2019-08-16 MED ORDER — LIDOCAINE HCL (PF) 1 % IJ SOLN
30.0000 mL | INTRAMUSCULAR | Status: AC | PRN
  Administered 2019-08-16: 30 mL via SUBCUTANEOUS
  Filled 2019-08-16: qty 30

## 2019-08-16 MED ORDER — SODIUM CHLORIDE 0.9% FLUSH
3.0000 mL | Freq: Two times a day (BID) | INTRAVENOUS | Status: DC
  Administered 2019-08-17: 3 mL via INTRAVENOUS

## 2019-08-16 MED ORDER — SIMETHICONE 80 MG PO CHEW: 80.0000 mg | CHEWABLE_TABLET | ORAL | Status: DC | PRN

## 2019-08-16 MED ORDER — PHENYLEPHRINE 40 MCG/ML (10ML) SYRINGE FOR IV PUSH (FOR BLOOD PRESSURE SUPPORT): 80.0000 ug | PREFILLED_SYRINGE | INTRAVENOUS | Status: DC | PRN

## 2019-08-16 MED ORDER — DIPHENHYDRAMINE HCL 50 MG/ML IJ SOLN: 12.5000 mg | INTRAMUSCULAR | Status: DC | PRN

## 2019-08-16 MED ORDER — OXYCODONE-ACETAMINOPHEN 5-325 MG PO TABS: 2.0000 | ORAL_TABLET | ORAL | Status: DC | PRN

## 2019-08-16 MED ORDER — BENZOCAINE-MENTHOL 20-0.5 % EX AERO
1.0000 "application " | INHALATION_SPRAY | CUTANEOUS | Status: DC | PRN
  Administered 2019-08-17: 1 via TOPICAL
  Filled 2019-08-16: qty 56

## 2019-08-16 MED ORDER — MISOPROSTOL 200 MCG PO TABS
ORAL_TABLET | ORAL | Status: AC
  Filled 2019-08-16: qty 4

## 2019-08-16 MED ORDER — LACTATED RINGERS IV SOLN: 500.0000 mL | INTRAVENOUS | Status: DC | PRN

## 2019-08-16 MED ORDER — ONDANSETRON HCL 4 MG PO TABS: 4.0000 mg | ORAL_TABLET | ORAL | Status: DC | PRN

## 2019-08-16 MED ORDER — EPHEDRINE 5 MG/ML INJ: 10.0000 mg | INTRAVENOUS | Status: DC | PRN

## 2019-08-16 MED ORDER — MISOPROSTOL 200 MCG PO TABS
400.0000 ug | ORAL_TABLET | Freq: Once | ORAL | Status: AC
  Administered 2019-08-16: 400 ug via BUCCAL

## 2019-08-16 MED ORDER — WITCH HAZEL-GLYCERIN EX PADS: 1.0000 "application " | MEDICATED_PAD | CUTANEOUS | Status: DC | PRN

## 2019-08-16 MED ORDER — PRENATAL MULTIVITAMIN CH
1.0000 | ORAL_TABLET | Freq: Every day | ORAL | Status: DC
  Administered 2019-08-17: 1 via ORAL
  Filled 2019-08-16: qty 1

## 2019-08-16 MED ORDER — COCONUT OIL OIL: 1.0000 "application " | TOPICAL_OIL | Status: DC | PRN

## 2019-08-16 MED ORDER — SODIUM CHLORIDE (PF) 0.9 % IJ SOLN
INTRAMUSCULAR | Status: DC | PRN
  Administered 2019-08-16: 12 mL/h via EPIDURAL

## 2019-08-16 MED ORDER — DIPHENHYDRAMINE HCL 25 MG PO CAPS: 25.0000 mg | ORAL_CAPSULE | Freq: Four times a day (QID) | ORAL | Status: DC | PRN

## 2019-08-16 MED ORDER — ONDANSETRON HCL 4 MG/2ML IJ SOLN: 4.0000 mg | Freq: Four times a day (QID) | INTRAMUSCULAR | Status: DC | PRN

## 2019-08-16 MED ORDER — MEASLES, MUMPS & RUBELLA VAC IJ SOLR: 0.5000 mL | Freq: Once | INTRAMUSCULAR | Status: DC

## 2019-08-16 MED ORDER — LACTATED RINGERS IV SOLN: INTRAVENOUS | Status: DC

## 2019-08-16 MED ORDER — OXYTOCIN 40 UNITS IN NORMAL SALINE INFUSION - SIMPLE MED
2.5000 [IU]/h | INTRAVENOUS | Status: DC
  Filled 2019-08-16: qty 1000

## 2019-08-16 MED ORDER — TETANUS-DIPHTH-ACELL PERTUSSIS 5-2.5-18.5 LF-MCG/0.5 IM SUSP: 0.5000 mL | Freq: Once | INTRAMUSCULAR | Status: DC

## 2019-08-16 MED ORDER — SENNOSIDES-DOCUSATE SODIUM 8.6-50 MG PO TABS
2.0000 | ORAL_TABLET | ORAL | Status: DC
  Administered 2019-08-17 – 2019-08-18 (×2): 2 via ORAL
  Filled 2019-08-16 (×2): qty 2

## 2019-08-16 MED ORDER — ONDANSETRON HCL 4 MG/2ML IJ SOLN: 4.0000 mg | INTRAMUSCULAR | Status: DC | PRN

## 2019-08-16 MED ORDER — FENTANYL CITRATE (PF) 100 MCG/2ML IJ SOLN: 100.0000 ug | INTRAMUSCULAR | Status: DC | PRN

## 2019-08-16 MED ORDER — OXYTOCIN BOLUS FROM INFUSION
500.0000 mL | Freq: Once | INTRAVENOUS | Status: AC
  Administered 2019-08-16: 500 mL via INTRAVENOUS

## 2019-08-16 MED ORDER — LIDOCAINE HCL (PF) 1 % IJ SOLN
INTRAMUSCULAR | Status: DC | PRN
  Administered 2019-08-16: 11 mL via EPIDURAL

## 2019-08-16 MED ORDER — PHENYLEPHRINE 40 MCG/ML (10ML) SYRINGE FOR IV PUSH (FOR BLOOD PRESSURE SUPPORT)
80.0000 ug | PREFILLED_SYRINGE | INTRAVENOUS | Status: DC | PRN
  Filled 2019-08-16: qty 10

## 2019-08-16 MED ORDER — ZOLPIDEM TARTRATE 5 MG PO TABS: 5.0000 mg | ORAL_TABLET | Freq: Every evening | ORAL | Status: DC | PRN

## 2019-08-16 MED ORDER — SOD CITRATE-CITRIC ACID 500-334 MG/5ML PO SOLN: 30.0000 mL | ORAL | Status: DC | PRN

## 2019-08-16 MED ORDER — DIBUCAINE (PERIANAL) 1 % EX OINT
1.0000 "application " | TOPICAL_OINTMENT | CUTANEOUS | Status: DC | PRN
  Filled 2019-08-16: qty 28

## 2019-08-16 MED ORDER — BISACODYL 10 MG RE SUPP: 10.0000 mg | Freq: Every day | RECTAL | Status: DC | PRN

## 2019-08-16 NOTE — MAU Note (Signed)
.   Krystal Lloyd is a 29 y.o. at [redacted]w[redacted]d here in MAU reporting: Patient reports that she left MAU at 0300 this morning and continued to contract all night. Patient reports bloody show. No LOF and endorses good fetal movement. Reports ctx are 2-5 mins apart.  Pain score: 10 Vitals:   08/16/19 0935  BP: 132/77  Pulse: (!) 106  Resp: 19  Temp: 98 F (36.7 C)     FHT:148

## 2019-08-16 NOTE — Anesthesia Preprocedure Evaluation (Signed)

## 2019-08-16 NOTE — Progress Notes (Signed)
Patient ID: Krystal Lloyd, female   DOB: [birth year], 29 y.o.   MRN: [id number]  Feeling some discomfort around her pelvic bone- PCA button helped  BP 109/56, P 82 FHR 130s, +accels, no decels, occ variables Ctx 2-4 mins, spont Cx 6+/80/vtx -1, +bloody show, ROM during exam for clear/bld tinged fluid  IUP@39 .0wks Active labor  -Continue expectant management as she is continuing to have adequate cx change; can start Pit prn if next exam isn't progressing -Anticipate vag del  Myrtis Ser CNM 08/16/2019

## 2019-08-16 NOTE — Anesthesia Procedure Notes (Signed)
Epidural Patient location during procedure: OB Start time: 08/16/2019 12:21 PM End time: 08/16/2019 12:27 PM  Staffing Anesthesiologist: Lynda Rainwater, MD Performed: anesthesiologist   Preanesthetic Checklist Completed: patient identified, IV checked, site marked, risks and benefits discussed, surgical consent, monitors and equipment checked, pre-op evaluation and timeout performed  Epidural Patient position: sitting Prep: ChloraPrep Patient monitoring: heart rate, cardiac monitor, continuous pulse ox and blood pressure Approach: midline Location: L2-L3 Injection technique: LOR saline  Needle:  Needle type: Tuohy  Needle gauge: 17 G Needle length: 9 cm Needle insertion depth: 5 cm Catheter type: closed end flexible Catheter size: 20 Guage Catheter at skin depth: 9 cm Test dose: negative  Assessment Events: blood not aspirated, injection not painful, no injection resistance, no paresthesia and negative IV test  Additional Notes Reason for block:procedure for pain

## 2019-08-16 NOTE — Discharge Summary (Signed)
Postpartum Discharge Summary      Patient Name: Krystal Lloyd DOB: 1991/02/26 MRN: [id number]  Date of admission: 08/16/2019 Delivery date:08/16/2019  Delivering provider: Wells Guiles R  Date of discharge: 08/18/2019  Admitting diagnosis: Labor and delivery indication for care or intervention [O75.9] Intrauterine pregnancy: [redacted]w[redacted]d    Secondary diagnosis:  Active Problems:   Hepatitis B affecting pregnancy, antepartum   Labor and delivery indication for care or intervention  Additional problems: none    Discharge diagnosis: Term Pregnancy Delivered                                              Post partum procedures: manual removal of placental parts from lower uterine segment Augmentation: AROM Complications: None  Hospital course: Onset of Labor With Vaginal Delivery      29y.o. yo G1P0000 at 339w0das admitted in Active Labor on 08/16/2019. Patient had an uncomplicated labor course as follows:  Membrane Rupture Time/Date: 3:28 PM ,08/16/2019   Delivery Method:Vaginal, Spontaneous  Episiotomy: None  Lacerations:  2nd degree;Perineal  Patient had an uncomplicated postpartum course.  She is ambulating, tolerating a regular diet, passing flatus, and urinating well. Patient is discharged home in stable condition on 08/18/19.  Newborn Data: Birth date:08/16/2019  Birth time:7:08 PM  Gender:Female  Living status:Living  Apgars:9 ,9  Weight:2821 g   Magnesium Sulfate received: No BMZ received: No Rhophylac:N/A MMR:N/A T-DaP:Given prenatally Flu: N/A Transfusion:No  Physical exam  Vitals:   08/17/19 0900 08/17/19 1300 08/17/19 2030 08/18/19 0520  BP: 115/69 106/69 103/62 109/74  Pulse: 77 78 83 82  Resp: 18 18 18 18   Temp: 98.1 F (36.7 C) 98.5 F (36.9 C) 98.8 F (37.1 C) 98.5 F (36.9 C)  TempSrc: Oral Oral Oral Oral  SpO2:  99%  99%  Weight:      Height:       General: alert, cooperative and no distress Lochia: appropriate Uterine Fundus: firm Incision:  N/A DVT Evaluation: No evidence of DVT seen on physical exam. Labs: Lab Results  Component Value Date   WBC 10.4 08/16/2019   HGB 13.2 08/16/2019   HCT 39.5 08/16/2019   MCV 89.4 08/16/2019   PLT 180 08/16/2019   CMP Latest Ref Rng & Units 08/16/2019  Glucose 70 - 99 mg/dL 93  BUN 6 - 20 mg/dL 6  Creatinine 0.44 - 1.00 mg/dL 0.48  Sodium 135 - 145 mmol/L 136  Potassium 3.5 - 5.1 mmol/L 3.7  Chloride 98 - 111 mmol/L 102  CO2 22 - 32 mmol/L 20(L)  Calcium 8.9 - 10.3 mg/dL 9.1  Total Protein 6.5 - 8.1 g/dL 7.1  Total Bilirubin 0.3 - 1.2 mg/dL 0.7  Alkaline Phos 38 - 126 U/L 147(H)  AST 15 - 41 U/L 19  ALT 0 - 44 U/L 15   Edinburgh Score: Edinburgh Postnatal Depression Scale Screening Tool 08/17/2019  I have been able to laugh and see the funny side of things. 0  I have looked forward with enjoyment to things. 0  I have blamed myself unnecessarily when things went wrong. 0  I have been anxious or worried for no good reason. 2  I have felt scared or panicky for no good reason. 1  Things have been getting on top of me. 1  I have been so unhappy that I have had difficulty sleeping.  1  I have felt sad or miserable. 0  I have been so unhappy that I have been crying. 1  The thought of harming myself has occurred to me. 0  Edinburgh Postnatal Depression Scale Total 6     After visit meds:  Allergies as of 08/18/2019   No Known Allergies     Medication List    TAKE these medications   acetaminophen 325 MG tablet Commonly known as: Tylenol Take 2 tablets (650 mg total) by mouth every 4 (four) hours as needed (for pain scale < 4).   ibuprofen 600 MG tablet Commonly known as: ADVIL Take 1 tablet (600 mg total) by mouth every 6 (six) hours.   PRENATAL VITAMIN PO Take by mouth.        Discharge home in stable condition Infant Feeding: Bottle Infant Disposition:home with mother Discharge instruction: per After Visit Summary and Postpartum booklet. Activity: Advance as  tolerated. Pelvic rest for 6 weeks.  Diet: routine diet Future Appointments: Future Appointments  Date Time Provider [place]  09/21/2019  3:00 PM Truett Mainland, DO [facility] None   Follow up Visit: [name], CNM  P [facility] Admin  Please schedule this patient for PP visit in: 4-6 weeks  Low risk pregnancy complicated by: +HepB  Delivery mode: SVD  Anticipated Birth Control: other/unsure  PP Procedures needed: referral to ID (Dr. Nehemiah Settle to do)  Schedule Integrated BH visit: no  Provider: MD    08/18/2019 Merilyn Baba, DO

## 2019-08-16 NOTE — H&P (Signed)
Krystal Lloyd is a 29 y.o. female G1 @ 39.0wks by LMP c/w 23wk scan presenting for reg ctx and cervical change. Denies leaking or bldg; no H/A, N/V or visual disturbances. Her preg has been followed by the [facility] office and has been remarkable for:  # late to care at [facility] # Hep B positive with mildly elevated viral load 08/05/19 (plan for ID postpartum) # GBS neg  OB History    Gravida  1   Para      Term      Preterm      AB      Living  0     SAB      TAB      Ectopic      Multiple      Live Births             Past Medical History:  Diagnosis Date  . Medical history non-contributory    Past Surgical History:  Procedure Laterality Date  . NO PAST SURGERIES     Family History: family history includes Healthy in her father and mother. Social History:  reports that she has never smoked. She has never used smokeless tobacco. She reports previous alcohol use. She reports that she does not use drugs.     Maternal Diabetes: No Genetic Screening: Normal Maternal Ultrasounds/Referrals: Normal Fetal Ultrasounds or other Referrals:  None Maternal Substance Abuse:  No Significant Maternal Medications:  None Significant Maternal Lab Results:  Group B Strep negative and HBsAG positive Other Comments:  mildly elevated Hep B viral load 08/05/19  Review of Systems History Dilation: 4.5 Effacement (%): 100 Station: -1, -2 Exam by:: Holly Flippin RN  Blood pressure 132/77, pulse (!) 106, temperature 98 F (36.7 C), temperature source Oral, resp. rate 19, height 5\' 1"  (1.549 m), weight 69 kg, last menstrual period 11/16/2018. Exam Physical Exam  Constitutional: She is oriented to person, place, and time. She appears well-developed.  HENT:  Head: Normocephalic.  Cardiovascular:  Sl tachycardic  Respiratory: Effort normal.  GI:  EFM 130s, +accels, no decels, Cat 1 Ctx q 3 mins  Genitourinary:    Genitourinary Comments: Change from 3cm>4.5cm/100/vtx -2    Musculoskeletal:        General: Normal range of motion.     Cervical back: Normal range of motion.  Neurological: She is alert and oriented to person, place, and time.  Skin: Skin is warm and dry.  Psychiatric: She has a normal mood and affect. Her behavior is normal. Thought content normal.    Prenatal labs: ABO, Rh: B/Positive/-- (01/08 1025) Antibody: Negative (01/08 1025) Rubella: 13.60 (01/08 1025) RPR: Non Reactive (03/05 0836)  HBsAg: Confirm. indicated (01/08 1025)  HIV: Non Reactive (03/05 0836)  GBS: Negative/-- (04/29 1609)   Assessment/Plan: IUP@39 .0wks Early active labor GBS neg Hep B positive  Admit to Labor and Delivery Expectant management to start Collect CMP with admit labs Avoid IFSE in labor Anticipate vag del Notify peds re infant status and plan for HBIG and Hep B vax shortly after birth   [place] 08/16/2019, 11:10 AM

## 2019-08-16 NOTE — Progress Notes (Signed)
Patient ID: Krystal Lloyd, female   DOB: 08-Jan-1991, 29 y.o.   MRN: [id number]  Called to room by L&D nurse, pt up to br to void, has large ballooning tissue coming from vagina. Bleeding has been normal. Pt denies dizziness. Just sore. Got pt back to bed, appears to be placental/membranes in nature, but uncertain b/c placenta appeared to be completely intact at birth. Called Dr. Nehemiah Settle in, tissue ruptured as he was manipulating it, definitely placental/membranes. Pt placed back into stirrups and spec exam by Dr. Nehemiah Settle w/ abdominal u/s guidance by Dr. Dione Plover which revealed retained products in LUS. These were slowly and carefully teased out by Dr. Nehemiah Settle w/o difficulty. Large amount of placental/membranes removed. Repeat u/s revealed nothing in uterus/cervix. Fundus firm, bleeding well controlled. Pt tolerated procedure well.  [name], CNM, [facility] 08/16/2019 9:15 PM

## 2019-08-16 NOTE — Progress Notes (Signed)
Patient ID: Krystal Lloyd, female   DOB: [birth year], 29 y.o.   MRN: [id number]  Comfortable with epidural- rec'd about 30 mins ago  BP 101/54, P 73 FHR 140s, +accels, occ variables Ctx q 2-3 mins Cx 5+/90/vtx -2  CMP     Component Value Date/Time   NA 136 08/16/2019 1126   NA 137 08/05/2019 0922   K 3.7 08/16/2019 1126   CL 102 08/16/2019 1126   CO2 20 (L) 08/16/2019 1126   GLUCOSE 93 08/16/2019 1126   BUN 6 08/16/2019 1126   BUN 7 08/05/2019 0922   CREATININE 0.48 08/16/2019 1126   CALCIUM 9.1 08/16/2019 1126   PROT 7.1 08/16/2019 1126   PROT 7.1 08/05/2019 0922   ALBUMIN 3.1 (L) 08/16/2019 1126   ALBUMIN 3.9 08/05/2019 0922   AST 19 08/16/2019 1126   ALT 15 08/16/2019 1126   ALKPHOS 147 (H) 08/16/2019 1126   BILITOT 0.7 08/16/2019 1126   BILITOT <0.2 08/05/2019 0922   GFRNONAA >60 08/16/2019 1126   GFRAA >60 08/16/2019 1126    IUP@term  Early active labor Hep B pos>stable LFTs GBS neg  -Plan to check cx in 2-3hrs or sooner prn symptoms; augment as needed -Avoid IFSE -Plan pp ID eval  Myrtis Ser CNM 08/16/2019 1:18 PM

## 2019-08-17 LAB — RPR: RPR Ser Ql: NONREACTIVE

## 2019-08-17 NOTE — Anesthesia Postprocedure Evaluation (Signed)
Anesthesia Post Note  Patient: [name]  Procedure(s) Performed: AN AD HOC LABOR EPIDURAL     Patient location during evaluation: Mother Baby Anesthesia Type: Epidural Level of consciousness: awake Pain management: satisfactory to patient Vital Signs Assessment: post-procedure vital signs reviewed and stable Respiratory status: spontaneous breathing Cardiovascular status: stable Anesthetic complications: no    Last Vitals:  Vitals:   08/17/19 0523 08/17/19 0900  BP: 104/65 115/69  Pulse: 85 77  Resp: 18 18  Temp: 36.8 C 36.7 C  SpO2: 100%     Last Pain:  Vitals:   08/17/19 0900  TempSrc: Oral  PainSc: 5    Pain Goal:                   [employer]

## 2019-08-17 NOTE — Progress Notes (Addendum)
Post Partum Day 1  Subjective:  Krystal Lloyd is a 29 y.o. G1P1001 [redacted]w[redacted]d s/p SVD.  No acute events overnight.  Pt denies problems with ambulating, voiding or po intake.  She denies nausea or vomiting.  Pain is well controlled.  She has had flatus. She has had bowel movement.  Lochia Small.  Plan for birth control is no method.  Method of Feeding: Breast feeding is going well.  Objective: BP 104/65 (BP Location: Right Arm)   Pulse 85   Temp 98.3 F (36.8 C) (Oral)   Resp 18   Ht 5\' 1"  (1.549 m)   Wt 68.9 kg   LMP 11/16/2018 (Exact Date)   SpO2 100%   Breastfeeding Unknown   BMI 28.72 kg/m   Physical Exam:  General: alert, cooperative and no distress Lochia:normal flow Chest: CTAB Heart: RRR no m/r/g Abdomen: +BS, soft, nontender, fundus firm below umbilicus Uterine Fundus: firm DVT Evaluation: No evidence of DVT seen on physical exam. Extremities: no edema  Recent Labs    08/16/19 1126  HGB 13.2  HCT 39.5    Assessment/Plan:  ASSESSMENT: Krystal Lloyd is a 29 y.o. G1P1001 [redacted]w[redacted]d ppd #1 s/p NSVD doing well.   Plan for discharge tomorrow  Hep B +: will need Infectious disease follow up    LOS: 1 day   Stark Klein 08/17/2019, 8:07 AM

## 2019-08-17 NOTE — Lactation Note (Addendum)
This note was copied from a baby's chart. Lactation Consultation Note  Patient Name: Krystal Lloyd Today's Date: 08/17/2019  P1, 40 hour female term infant. LC entered the room, infant asleep and swaddle in basinet. Per mom, she BF infant twice and given formula twice. Mom was given 24 mm NS by nurse. Mom feels breastfeeding is going well, infant is breastfeeding most feedings for 10 to 20 minutes. LC did not see a latch due to mom giving infant formula less than one hour prior to [facility] entering the room. Mom understands to breastfeed infant according to hunger cues, 8 to 12 times within 24 hours and not exceed 3 hours without BF infant. Mom plans to start latching infant at breast for each feeding and then supplement with formula afterwards to help establish her milk supply. Mom knows to call RN or [place] if she needs assistance with latching infant at breast. RN will set mom up with DEBP due to giving her a 24 mm NS. Reviewed Baby & Me book's Breastfeeding Basics.  Mom made aware of O/P services, breastfeeding support groups, community resources, and our phone # for post-discharge questions.     Maternal Data    Feeding Feeding Type: Formula Nipple Type: Slow - flow  LATCH Score                   Interventions    Lactation Tools Discussed/Used     Consult Status      Vicente Serene 08/17/2019, 4:15 AM

## 2019-08-17 NOTE — Lactation Note (Signed)
This note was copied from a baby's chart. Lactation Consultation Note  Patient Name: Krystal Lloyd [id number] Date: 08/17/2019 Reason for consult: Follow-up assessment Mom reports it is about time for him to eat.  [name] Krystal Lloyd now 42 hours old.   Dad unswaddled infant brought him to mom.  Parents trying to feed in side lying but mom unable to completely lay on her side and cant get him in a good position to latch him.  Assist with trying him in cross cradle hold and he will open and take a few sucks and hold nipple in his mouth. Mom reports this is what he does during feeds. He did this a few times and then stopped attempting.  Showed parents how to hand express and spoon feed him.  Dad able to hand express colostrum easily.   Left parents hand expressing and spoon feeding colostrum.  Mom reports she has been trying without the nipple shield.  Has not inititiated pumping with DEBP.  Gave mom manual pump to help evert nipples prior to latching.  Showed her how to use it and she was able to demo back.  Urged her to call for feeding assist at next feeding.  Call lactation as needed.   Maternal Data    Feeding Feeding Type: Breast Fed  LATCH Score Latch: Repeated attempts needed to sustain latch, nipple held in mouth throughout feeding, stimulation needed to elicit sucking reflex.  Audible Swallowing: None  Type of Nipple: Everted at rest and after stimulation  Comfort (Breast/Nipple): Soft / non-tender  Hold (Positioning): Assistance needed to correctly position infant at breast and maintain latch.  LATCH Score: 6  Interventions Interventions: Assisted with latch;Breast massage;Hand express;Pre-pump if needed;Position options;Expressed milk;Hand pump  Lactation Tools Discussed/Used     Consult Status Consult Status: Follow-up Date: 08/18/19 Follow-up type: [place] 08/17/2019, 4:22 PM

## 2019-08-18 ENCOUNTER — Encounter: Payer: Medicaid Other | Admitting: Family Medicine

## 2019-08-18 MED ORDER — ACETAMINOPHEN 325 MG PO TABS: 650.0000 mg | ORAL_TABLET | ORAL | 0 refills | Status: DC | PRN

## 2019-08-18 MED ORDER — IBUPROFEN 600 MG PO TABS: 600.0000 mg | ORAL_TABLET | Freq: Four times a day (QID) | ORAL | 0 refills | Status: DC

## 2019-08-18 NOTE — Discharge Instructions (Signed)

## 2019-08-18 NOTE — Lactation Note (Signed)
This note was copied from a baby's chart. Lactation Consultation Note  Patient Name: Krystal Lloyd [id number] Date: 08/18/2019 Reason for consult: Follow-up assessment  Baby is 74 hours old / 5 % weight loss , now less than 6 pounds .  Per mom baby has eaten since 5 :30 am when baby  had a bottle.  LC recommended checking diaper and placing the baby STS, and LC offered to assist  With latch, positioning and depth. Baby wide awake and rooting.  LC noted areola edema, and had mom massage her breast , pre - pump with hand pump.  Latched with depth and baby fed for 15 mins with swallows, increased with breast compressions and moist warm compress on the breast. Nipple well rounded when baby released. LC assisted to latch on the left breast in the football position with depth and baby fed for 10 mins with swallows.  Mom denies soreness, feels the Nipple Shield was not liked by the baby, and plans probably to  Breast / formula. Per mom has a DEBP at home.  LC reviewed supply and demand .  LC Recommended prior to every  latch until areola consistently more compressible prior to latch - breast massage , hand express, pre-pump to prime the milk ducts and latch with firm Support and compressions. Moist warm compress on the top  Of the breast can help with let down.  Sore nipple and engorgement prevention and tx reviewed.  Mom has a hand pump and LC provided shells with instructions.  LC reviewed breast feeding basics and then did teach back with mom and dad and they were both able to explain back the [facility] plan .  Due to the baby being less than 6 pounds now , LC recommended adding post pumping after 4 feedings a day for 10 -15 mins and then supplement back to baby.  LC provided the [facility] pamphlet with phone numbers.    Maternal Data Has patient been taught Hand Expression?: Yes  Feeding Feeding Type: Breast Fed  LATCH Score Latch: Grasps breast easily, tongue down, lips flanged, rhythmical  sucking.  Audible Swallowing: Spontaneous and intermittent  Type of Nipple: Everted at rest and after stimulation  Comfort (Breast/Nipple): Filling, red/small blisters or bruises, mild/mod discomfort  Hold (Positioning): Assistance needed to correctly position infant at breast and maintain latch.  LATCH Score: 8  Interventions Interventions: Breast feeding basics reviewed;Assisted with latch;Skin to skin;Breast massage;Hand express;Breast compression;Adjust position;Support pillows;Position options  Lactation Tools Discussed/Used Tools: Shells;Pump;Flanges Flange Size: 24 Shell Type: Inverted Breast pump type: Manual Pump Review: Milk Storage;Setup, frequency, and cleaning(mom had a hand pump and LC reviewed) Initiated by:: reviewed / MAI   Consult Status Consult Status: Complete Date: 08/18/19 Follow-up type: [place] 08/18/2019, 10:48 AM

## 2019-09-21 ENCOUNTER — Ambulatory Visit (INDEPENDENT_AMBULATORY_CARE_PROVIDER_SITE_OTHER): Payer: Medicaid Other | Admitting: Family Medicine

## 2019-09-21 ENCOUNTER — Other Ambulatory Visit: Payer: Self-pay

## 2019-09-21 DIAGNOSIS — O9843 Viral hepatitis complicating the puerperium: Secondary | ICD-10-CM

## 2019-09-21 DIAGNOSIS — B1910 Unspecified viral hepatitis B without hepatic coma: Secondary | ICD-10-CM

## 2019-09-21 NOTE — Progress Notes (Signed)
    [place] Partum Visit Note  Krystal Lloyd is a 29 y.o. G69P1001 female who presents for a postpartum visit. She is 4 weeks postpartum following a normal spontaneous vaginal delivery.  I have fully reviewed the prenatal and intrapartum course. The delivery was at 30 gestational weeks.  Anesthesia: epidural. Postpartum course has been uneventful. Baby is doing well uneventful. Baby is feeding by bottle - gerber. Bleeding no bleeding. Bowel function is normal. Bladder function is normal. Patient is not sexually active. Contraception method is none. Postpartum depression screening:negative  (2) Last pap 04/08/2019- WNL  Review of Systems Pertinent items are noted in HPI.    Objective:  Blood pressure 116/72, pulse 72, height 5\' 1"  (1.549 m), weight 125 lb (56.7 kg), not currently breastfeeding.  General:  alert, cooperative and no distress  Lungs: clear to auscultation bilaterally  Heart:  regular rate and rhythm, S1, S2 normal, no murmur, click, rub or gallop  Abdomen: soft, non-tender; bowel sounds normal; no masses,  no organomegaly        Assessment:   Pap smear not done at today's visit.   Plan:   Essential components of care per ACOG recommendations:  1.  Mood and well being: Patient with negative depression screening today. Reviewed local resources for support.  - Patient does not use tobacco.  2. Infant care and feeding:  -Patient currently breastmilk feeding? No  -Social determinants of health (SDOH) reviewed in EPIC. No concerns  3. Sexuality, contraception and birth spacing - Patient does not want a pregnancy in the next year.   - Reviewed forms of contraception in tiered fashion. Patient desired condoms today.   - Discussed birth spacing of 18 months  4. Sleep and fatigue -Encouraged family/partner/community support of 4 hrs of uninterrupted sleep to help with mood and fatigue  5. Physical Recovery  - Discussed patients delivery and complications - Patient had a 2nd  degree laceration, perineal healing reviewed. Patient expressed understanding - Patient has urinary incontinence? No  - Patient is safe to resume physical and sexual activity  6.  Health Maintenance - Last pap smear done and was normal with negative HPV.   7. Hep B ID referral placed.  [place] for [employer], [place]

## 2019-09-28 ENCOUNTER — Ambulatory Visit (INDEPENDENT_AMBULATORY_CARE_PROVIDER_SITE_OTHER): Payer: Medicaid Other | Admitting: Infectious Disease

## 2019-09-28 ENCOUNTER — Encounter: Payer: Self-pay | Admitting: Infectious Disease

## 2019-09-28 ENCOUNTER — Other Ambulatory Visit: Payer: Self-pay

## 2019-09-28 VITALS — BP 111/70 | HR 67 | Temp 98.2°F | Wt 129.0 lb

## 2019-09-28 DIAGNOSIS — L818 Other specified disorders of pigmentation: Secondary | ICD-10-CM

## 2019-09-28 DIAGNOSIS — O98419 Viral hepatitis complicating pregnancy, unspecified trimester: Secondary | ICD-10-CM | POA: Diagnosis not present

## 2019-09-28 DIAGNOSIS — B1910 Unspecified viral hepatitis B without hepatic coma: Secondary | ICD-10-CM | POA: Diagnosis not present

## 2019-09-28 HISTORY — DX: Other specified disorders of pigmentation: L81.8

## 2019-09-28 NOTE — Progress Notes (Signed)
Subjective:   Reason for Infectious Disease Consult: Chronic hepatitis B  Requesting Physician/: Dr. Nehemiah Settle   Patient ID: Krystal Lloyd, female    DOB: 1991-01-18, 29 y.o.   MRN: [id number]  HPI   29 year old [place] lady who appears to have congenitally acquired chronic hepatitis B. She was recently pregnant and had very low HBV DNA level, Hep B Sag +, no elevation of LFTs.   She presents for further evaluation for treatment and management.  Past Medical History:  Diagnosis Date  . Hepatitis B   . Medical history non-contributory     Past Surgical History:  Procedure Laterality Date  . NO PAST SURGERIES      Family History  Problem Relation Age of Onset  . Healthy Mother   . Healthy Father   . Cancer Neg Hx       Social History   Socioeconomic History  . Marital status: Single    Spouse name: Vilinda Boehringer  . Number of children: 0  . Years of education: Highschool grad  . Highest education level: 12th grade  Occupational History  . Not on file  Tobacco Use  . Smoking status: Never Smoker  . Smokeless tobacco: Never Used  Vaping Use  . Vaping Use: Never used  Substance and Sexual Activity  . Alcohol use: Not Currently    Comment: Socillay  . Drug use: Never  . Sexual activity: Yes  Other Topics Concern  . Not on file  Social History Narrative  . Not on file   Social Determinants of Health   Financial Resource Strain:   . Difficulty of Paying Living Expenses:   Food Insecurity:   . Worried About Charity fundraiser in the Last Year:   . Arboriculturist in the Last Year:   Transportation Needs:   . Film/video editor (Medical):   Marland Kitchen Lack of Transportation (Non-Medical):   Physical Activity:   . Days of Exercise per Week:   . Minutes of Exercise per Session:   Stress:   . Feeling of Stress :   Social Connections:   . Frequency of Communication with Friends and Family:   . Frequency of Social Gatherings with Friends and Family:   . Attends  Religious Services:   . Active Member of Clubs or Organizations:   . Attends Archivist Meetings:   Marland Kitchen Marital Status:     No Known Allergies   Current Outpatient Medications:  .  Prenatal Vit-Fe Fumarate-FA (PRENATAL VITAMIN PO), Take by mouth., Disp: , Rfl:    Review of Systems  Constitutional: Negative for activity change, appetite change, chills, diaphoresis, fatigue, fever and unexpected weight change.  HENT: Negative for congestion, rhinorrhea, sinus pressure, sneezing, sore throat and trouble swallowing.   Eyes: Negative for photophobia and visual disturbance.  Respiratory: Negative for cough, chest tightness, shortness of breath, wheezing and stridor.   Cardiovascular: Negative for chest pain, palpitations and leg swelling.  Gastrointestinal: Negative for abdominal distention, abdominal pain, anal bleeding, blood in stool, constipation, diarrhea, nausea and vomiting.  Genitourinary: Negative for difficulty urinating, dysuria, flank pain and hematuria.  Musculoskeletal: Negative for arthralgias, back pain, gait problem, joint swelling and myalgias.  Skin: Negative for color change, pallor, rash and wound.  Neurological: Negative for dizziness, tremors, weakness and light-headedness.  Hematological: Negative for adenopathy. Does not bruise/bleed easily.  Psychiatric/Behavioral: Negative for agitation, behavioral problems, confusion, decreased concentration, dysphoric mood and sleep disturbance.  Objective:   Physical Exam Constitutional:      General: She is not in acute distress.    Appearance: She is well-developed. She is not diaphoretic.  HENT:     Head: Normocephalic and atraumatic.     Mouth/Throat:     Pharynx: No oropharyngeal exudate.  Eyes:     General: No scleral icterus.    Conjunctiva/sclera: Conjunctivae normal.  Cardiovascular:     Rate and Rhythm: Normal rate and regular rhythm.  Pulmonary:     Effort: Pulmonary effort is normal. No  respiratory distress.     Breath sounds: No wheezing.  Abdominal:     General: There is no distension.  Musculoskeletal:        General: No tenderness.     Cervical back: Normal range of motion and neck supple.  Skin:    General: Skin is warm and dry.     Coloration: Skin is not pale.     Findings: No erythema or rash.  Neurological:     General: No focal deficit present.     Mental Status: She is alert and oriented to person, place, and time.     Motor: No abnormal muscle tone.     Coordination: Coordination normal.  Psychiatric:        Mood and Affect: Mood normal.        Behavior: Behavior normal.        Thought Content: Thought content normal.        Judgment: Judgment normal.           Assessment & Plan:  Chronic hepatitis B without hepatic coma:  Check Hep B sag, E Ag E Ag Antbibody, Hep B DNA, CMP  Hep A total , HCV  She will later need screening for [facility]  Tattoos: another reason to check HCV

## 2019-10-05 LAB — HEPATITIS B E ANTIGEN: Hep B E Ag: NONREACTIVE

## 2019-10-05 LAB — HEPATITIS C ANTIBODY
Hepatitis C Ab: NONREACTIVE
SIGNAL TO CUT-OFF: 0.01 (ref <1.00)

## 2019-10-05 LAB — HEPATITIS B E ANTIBODY: Hep B E Ab: REACTIVE — AB

## 2019-10-05 LAB — COMPLETE METABOLIC PANEL WITH GFR
AG Ratio: 1.4 (calc) (ref 1.0–2.5)
ALT: 9 U/L (ref 6–29)
AST: 21 U/L (ref 10–30)
Albumin: 4.3 g/dL (ref 3.6–5.1)
Alkaline phosphatase (APISO): 64 U/L (ref 31–125)
BUN/Creatinine Ratio: 7 (calc) (ref 6–22)
BUN: 4 mg/dL — ABNORMAL LOW (ref 7–25)
CO2: 27 mmol/L (ref 20–32)
Calcium: 9.5 mg/dL (ref 8.6–10.2)
Chloride: 105 mmol/L (ref 98–110)
Creat: 0.59 mg/dL (ref 0.50–1.10)
GFR, Est African American: 146 mL/min/{1.73_m2} (ref >=60)
GFR, Est Non African American: 126 mL/min/{1.73_m2} (ref >=60)
Globulin: 3.1 g/dL (calc) (ref 1.9–3.7)
Glucose, Bld: 81 mg/dL (ref 65–99)
Potassium: 3.9 mmol/L (ref 3.5–5.3)
Sodium: 139 mmol/L (ref 135–146)
Total Bilirubin: 0.6 mg/dL (ref 0.2–1.2)
Total Protein: 7.4 g/dL (ref 6.1–8.1)

## 2019-10-05 LAB — HEPATITIS B DNA, ULTRAQUANTITATIVE, PCR
Hepatitis B DNA (Calc): 2.94 Log IU/mL — ABNORMAL HIGH
Hepatitis B DNA: 876 IU/mL — ABNORMAL HIGH

## 2019-10-05 LAB — HEPATITIS DELTA VIRUS ANTIGEN: Hepatitis D Antigen: NOT DETECTED

## 2019-10-05 LAB — HEPATITIS A ANTIBODY, TOTAL: Hepatitis A AB,Total: REACTIVE — AB

## 2019-10-05 LAB — HEPATITIS B SURFACE ANTIGEN: Hepatitis B Surface Ag: REACTIVE — AB

## 2020-04-02 ENCOUNTER — Ambulatory Visit: Payer: Medicaid Other | Admitting: Infectious Disease

## 2020-05-11 IMAGING — US US MFM OB FOLLOW-UP
1 series · 14 of 28 positions shown · non-contrast
Comparison: none

[Series 1: us mfm ob follow-up · 14 of 30 slices shown]
[im 2/30]
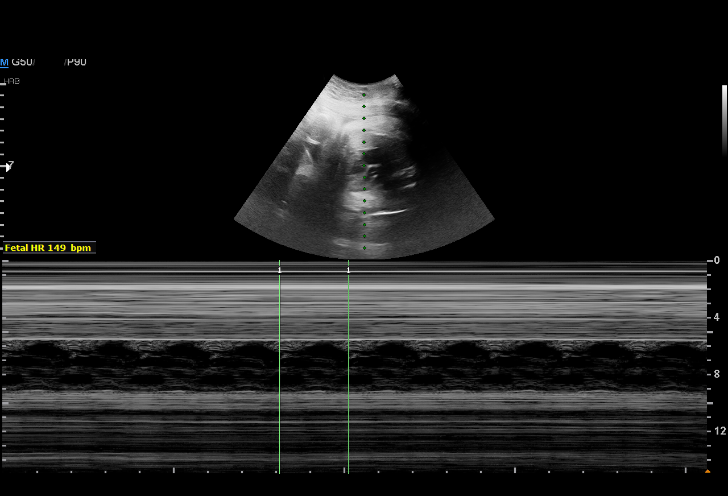
[im 4/30]
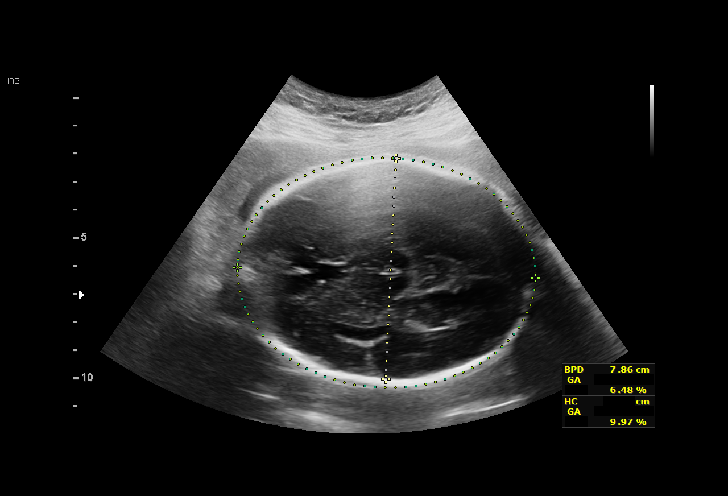
[im 6/30]
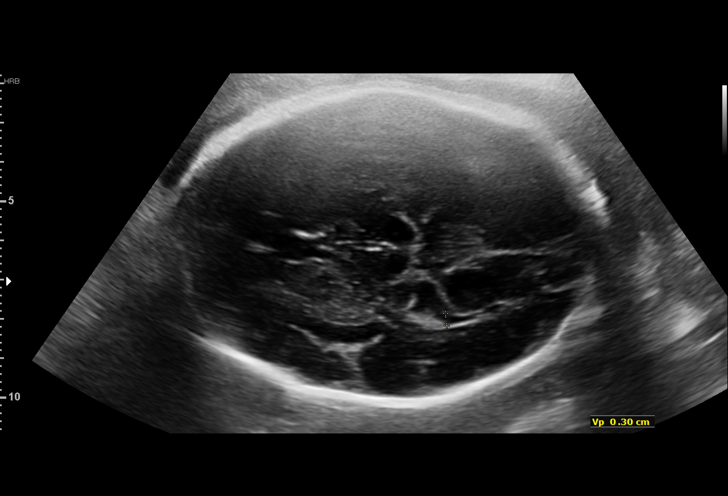
[im 8/30]
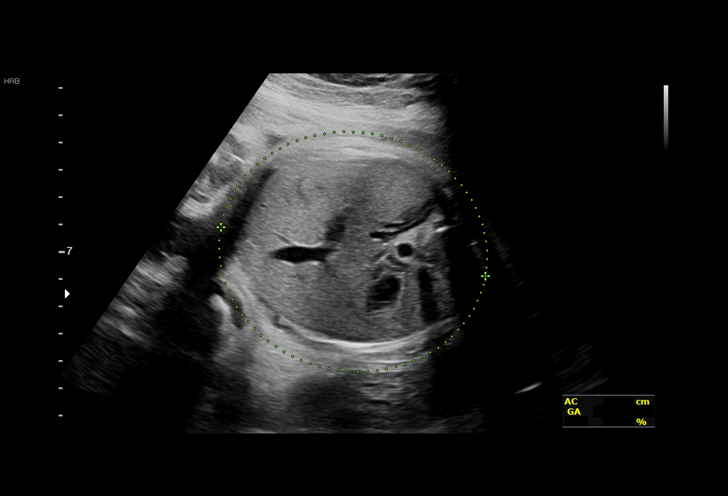
[im 10/30]
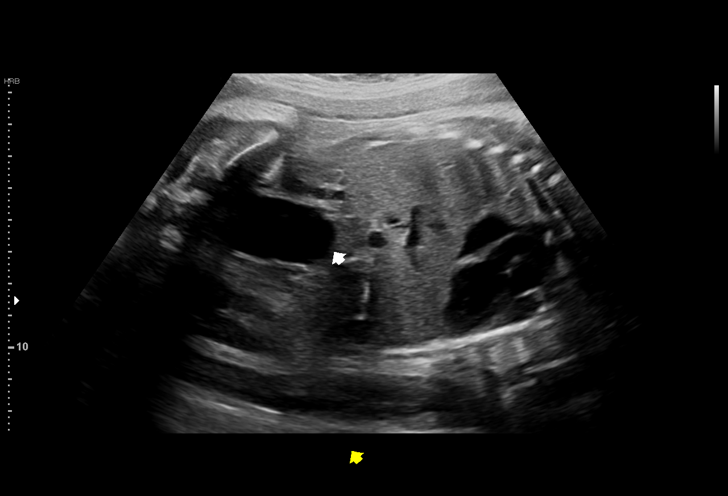
[im 12/30]
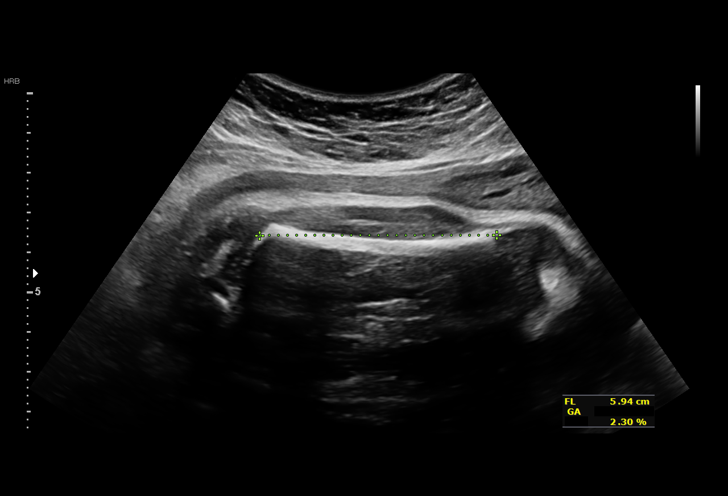
[im 14/30]
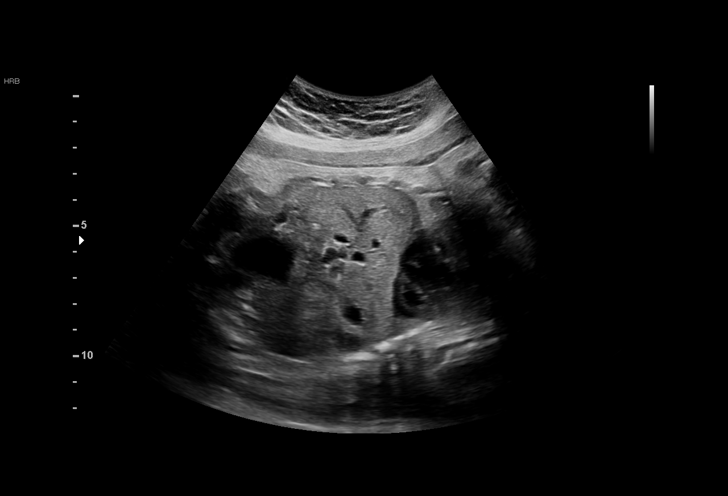
[im 17/30]
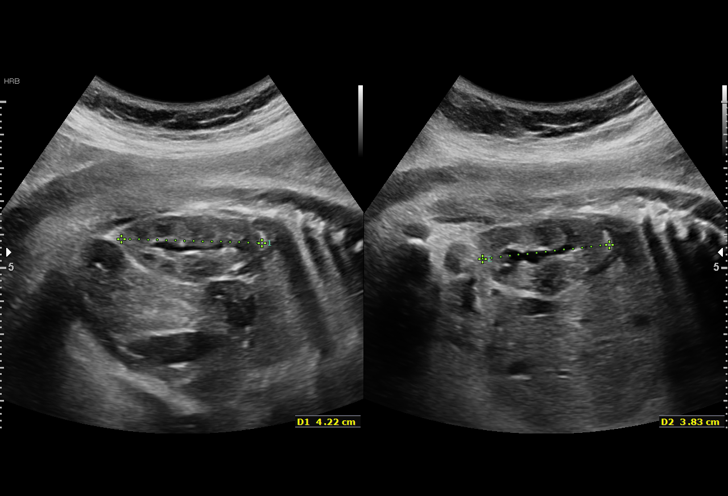
[im 19/30]
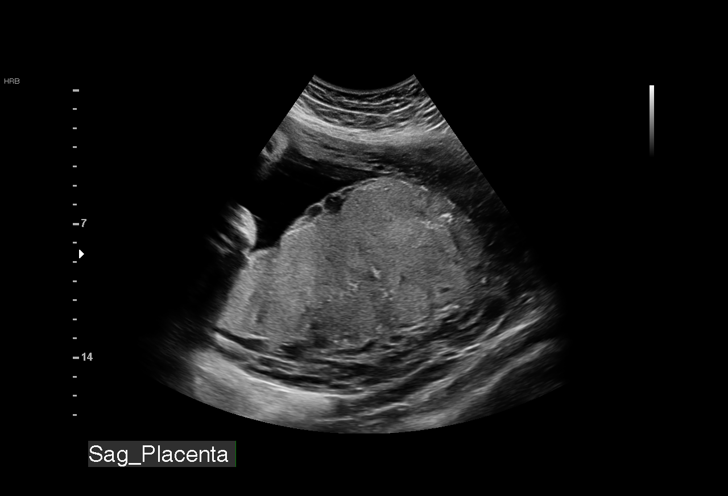
[im 21/30]
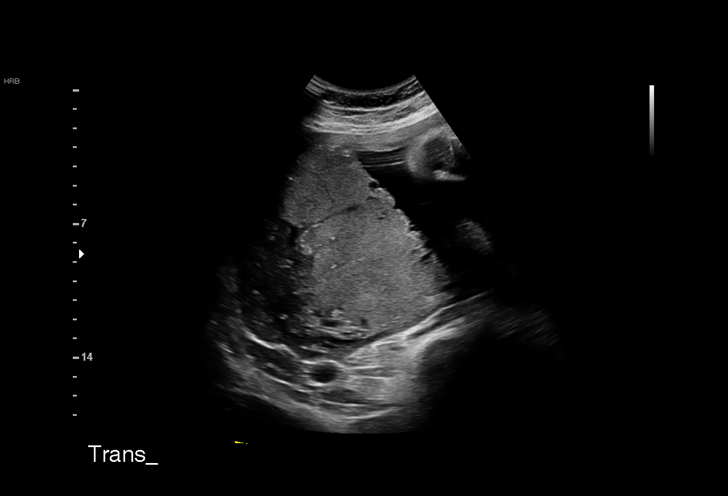
[im 23/30]
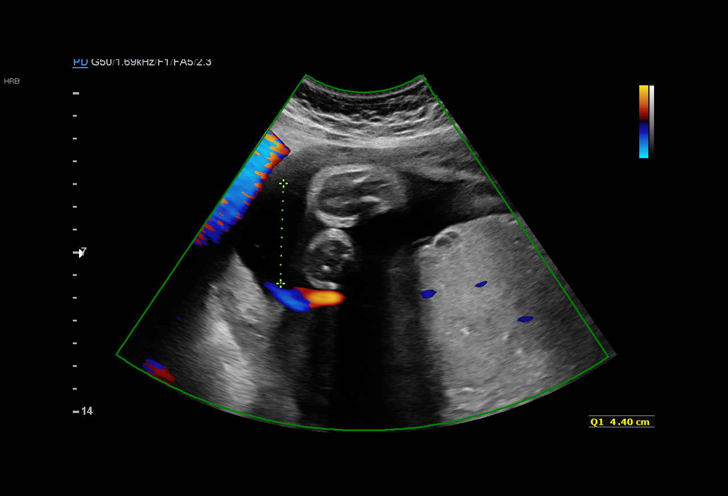
[im 25/30]
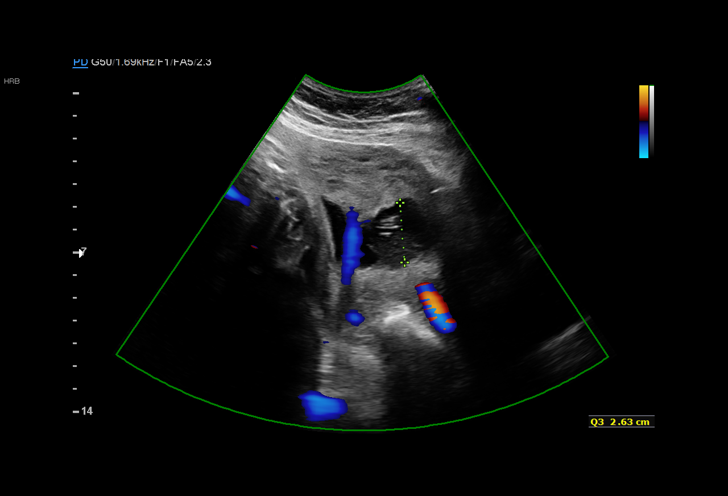
[im 27/30]
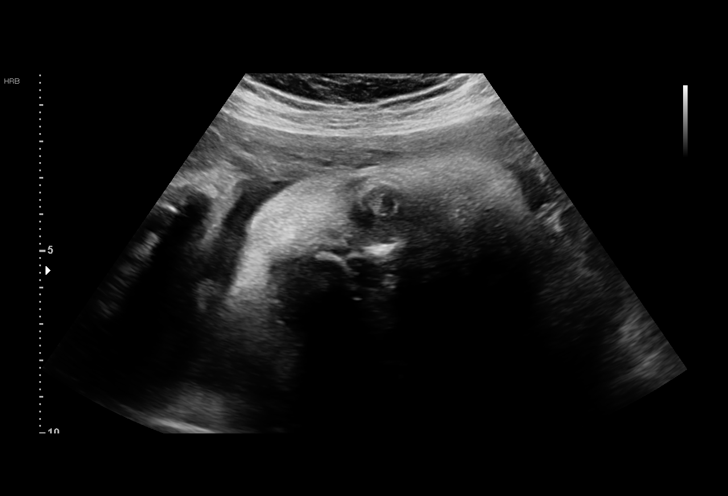
[im 30/30]
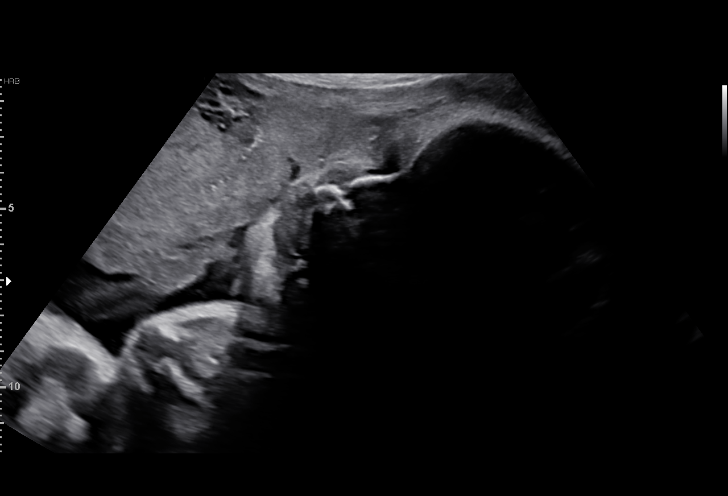

[14 of 28 positions shown; findings below may reference images not displayed]

----------------------------------------------------------------------

 ----------------------------------------------------------------------
Indications

  Uterine fibroids
  Insufficient Prenatal Care
  Encounter for other antenatal screening
  follow-up (Low risk NIPS, neg AFP)
  33 weeks gestation of pregnancy
 ----------------------------------------------------------------------
Fetal Evaluation

 Num Of Fetuses:         1
 Fetal Heart Rate(bpm):  149
 Cardiac Activity:       Observed
 Presentation:           Cephalic
 Placenta:               Posterior right lateral
 P. Cord Insertion:      Previously Visualized

 Amniotic Fluid
 AFI FV:      Within normal limits

 AFI Sum(cm)     %Tile       Largest Pocket(cm)
 8.98            11

 RUQ(cm)       RLQ(cm)       LUQ(cm)        LLQ(cm)
 4.4           1.95          0
Biometry

 BPD:      78.7  mm     G. Age:  31w 4d          7  %    CI:        71.16   %    70 - 86
                                                         FL/HC:      20.1   %    19.9 -
 HC:      297.2  mm     G. Age:  32w 6d          9  %    HC/AC:      1.01        0.96 -
 AC:      293.5  mm     G. Age:  33w 2d         53  %    FL/BPD:     76.0   %    71 - 87
 FL:       59.8  mm     G. Age:  31w 1d          3  %    FL/AC:      20.4   %    20 - 24

 LV:          3  mm

 Est. FW:    9638  gm      4 lb 6 oz     20  %
OB History

 Gravidity:    1
Gestational Age

 Clinical EDD:  33w 2d                                        EDD:   08/23/19
 U/S Today:     32w 2d                                        EDD:   08/30/19
 Best:          33w 2d     Det. By:  Clinical EDD             EDD:   08/23/19
Anatomy

 Cranium:               Appears normal         Aortic Arch:            Previously seen
 Cavum:                 Appears normal         Ductal Arch:            Previously seen
 Ventricles:            Appears normal         Diaphragm:              Appears normal
 Choroid Plexus:        Previously seen        Stomach:                Appears normal, left
                                                                       sided
 Cerebellum:            Previously seen        Abdomen:                Appears normal
 Posterior Fossa:       Previously seen        Abdominal Wall:         Previously seen
 Nuchal Fold:           Previously seen        Cord Vessels:           Previously seen
 Face:                  Profile nl; orbits     Kidneys:                Appear normal
                        previously seen
 Lips:                  Previously seen        Bladder:                Appears normal
 Thoracic:              Appears normal         Spine:                  Previously seen
 Heart:                 Previously seen        Upper Extremities:      Previously seen
 RVOT:                  Previously seen        Lower Extremities:      Previously seen
 LVOT:                  Previously seen

 Other:  3VV, Technically difficult due to fetal position.
Cervix Uterus Adnexa

 Cervix
 Not visualized (advanced GA >64wks)
Comments

 This patient was seen for a follow up growth scan due to a
 fibroid uterus.  She denies any problems since her last exam.
 She was informed that the fetal growth and amniotic fluid
 level appears appropriate for her gestational age.
 As the fetal growth is within normal limits, no further exams
 were scheduled in our office.

## 2021-03-31 NOTE — L&D Delivery Note (Signed)
I was called and informed that pt had progressed from 3cm dilation at 7am to 7cm dilation in an hour and was involuntarily pushing shortly after epidural.  I arrived at the hopital 24mns later ( parking deck under construction) and on entry into the room baby had just been handed off on mothers abdomen.  Delivered by midwife wLoni Musewas called to standby. Mother and baby stable.   Pitocin had been started via IV, bleeding stable, fundus firm  I proceeded to deliver the placenta which appeared calcified and had trailing membranes. Uterine sweep noted more placental tissue but attempt to evacuate it was unsuccessful due to adherence.   Pt reported discomfort with attempts ( never got very comfortable from epidural) thus a dose of fentanyl was given and another sweep done. When pt remained uncomfortable and I was informed there was no banjo currette available on the floor ( or in OR), I advised pt needed to be moved to the OR for better surgical and medical treatment.   9i spoke with both pt and her husband to explain need for move to OR - better pain control, better lighting, better equipments and better access to hemostatic agents.   Staff notified after consent obtained.   EBL at this point approx 5080mper estimation

## 2021-03-31 NOTE — L&D Delivery Note (Addendum)
   Delivery Note:   G2P1001 at [redacted]w[redacted]d Admitting diagnosis: Term pregnancy [Z34.90]   CNM called to bedside for a Delivery Standby. DO in route. Patient involuntarily bearing down 20 mins after epidural placement. Patient in Lithotomy position with CNM and L&D staff support at bedside. Fetal Head +4 during contractions. FOB present for birth and supportive.   Delivery of a Live born female  Birth Weight:   APGAR: 971,9    Newborn Delivery   Birth date/time: 11/29/2021 08:31:00 Delivery type: Vaginal, Spontaneous      Fetal head delivered in cephalic presentation, DOA position and spontaneously restituted  to LOA position. On next contraction fetus forcefully expelled with a compound right hand. Nuchal x1 identified and reduced after delivery. Vigorously crying infant placed immediately skin to skin with mom.  Nuchal Cord: Yes    After 2 mins of life cord double clamped  and cut by FOB.  Collection of cord blood for typing completed. Cord blood donation-None  Arterial cord blood sample-No    Third Stage:  DO at bedside. Bleeding minimal. Report given to C. Dakwan Pridgen, DO. Please see DO report for remaining delivery details.  Delivery Report:  Review the Delivery Report for details.    Krystal Lloyd (Isaias Sakai PRollene Rotunda MSN, [place] 11/29/21 8:41 AM

## 2021-05-09 LAB — HEPATITIS C ANTIBODY: HCV Ab: NEGATIVE

## 2021-05-09 LAB — OB RESULTS CONSOLE HEPATITIS B SURFACE ANTIGEN: Hepatitis B Surface Ag: POSITIVE

## 2021-05-09 LAB — OB RESULTS CONSOLE RUBELLA ANTIBODY, IGM: Rubella: IMMUNE

## 2021-05-09 LAB — OB RESULTS CONSOLE ABO/RH: RH Type: POSITIVE

## 2021-05-09 LAB — OB RESULTS CONSOLE ANTIBODY SCREEN: Antibody Screen: NEGATIVE

## 2021-05-09 LAB — OB RESULTS CONSOLE RPR: RPR: NONREACTIVE

## 2021-05-09 LAB — OB RESULTS CONSOLE HIV ANTIBODY (ROUTINE TESTING): HIV: NONREACTIVE

## 2021-05-09 LAB — OB RESULTS CONSOLE GC/CHLAMYDIA
Chlamydia: NEGATIVE
Neisseria Gonorrhea: NEGATIVE

## 2021-11-01 LAB — OB RESULTS CONSOLE GBS: GBS: NEGATIVE

## 2021-11-21 ENCOUNTER — Telehealth (HOSPITAL_COMMUNITY): Payer: Self-pay | Admitting: *Deleted

## 2021-11-21 ENCOUNTER — Encounter (HOSPITAL_COMMUNITY): Payer: Self-pay | Admitting: *Deleted

## 2021-11-21 NOTE — Telephone Encounter (Signed)
Preadmission screen  

## 2021-11-28 ENCOUNTER — Encounter (HOSPITAL_COMMUNITY): Payer: Self-pay | Admitting: Obstetrics and Gynecology

## 2021-11-28 ENCOUNTER — Inpatient Hospital Stay (HOSPITAL_COMMUNITY)
Admission: AD | Admit: 2021-11-28 | Discharge: 2021-12-01 | DRG: 797 | Disposition: A | Discharge status: Home / Self Care | Payer: Medicaid Other | Attending: Obstetrics and Gynecology | Admitting: Obstetrics and Gynecology

## 2021-11-28 ENCOUNTER — Inpatient Hospital Stay (HOSPITAL_COMMUNITY): Payer: Medicaid Other

## 2021-11-28 DIAGNOSIS — Z3A40 40 weeks gestation of pregnancy: Secondary | ICD-10-CM | POA: Diagnosis not present

## 2021-11-28 DIAGNOSIS — O480 Post-term pregnancy: Secondary | ICD-10-CM | POA: Diagnosis present

## 2021-11-28 DIAGNOSIS — B181 Chronic viral hepatitis B without delta-agent: Secondary | ICD-10-CM | POA: Diagnosis present

## 2021-11-28 DIAGNOSIS — O9081 Anemia of the puerperium: Secondary | ICD-10-CM | POA: Diagnosis not present

## 2021-11-28 DIAGNOSIS — D62 Acute posthemorrhagic anemia: Secondary | ICD-10-CM | POA: Diagnosis not present

## 2021-11-28 DIAGNOSIS — O9842 Viral hepatitis complicating childbirth: Secondary | ICD-10-CM | POA: Diagnosis present

## 2021-11-28 DIAGNOSIS — Z3490 Encounter for supervision of normal pregnancy, unspecified, unspecified trimester: Principal | ICD-10-CM

## 2021-11-28 LAB — CBC
HCT: 33.8 % — ABNORMAL LOW (ref 36.0–46.0)
Hemoglobin: 11.5 g/dL — ABNORMAL LOW (ref 12.0–15.0)
MCH: 30.1 pg (ref 26.0–34.0)
MCHC: 34 g/dL (ref 30.0–36.0)
MCV: 88.5 fL (ref 80.0–100.0)
Platelets: 183 10*3/uL (ref 150–400)
RBC: 3.82 MIL/uL — ABNORMAL LOW (ref 3.87–5.11)
RDW: 13.2 % (ref 11.5–15.5)
WBC: 9.7 10*3/uL (ref 4.0–10.5)
nRBC: 0 % (ref 0.0–0.2)

## 2021-11-28 LAB — TYPE AND SCREEN
ABO/RH(D): B POS
Antibody Screen: NEGATIVE

## 2021-11-28 MED ORDER — ACETAMINOPHEN 325 MG PO TABS: 650.0000 mg | ORAL_TABLET | ORAL | Status: DC | PRN

## 2021-11-28 MED ORDER — OXYCODONE-ACETAMINOPHEN 5-325 MG PO TABS: 1.0000 | ORAL_TABLET | ORAL | Status: DC | PRN

## 2021-11-28 MED ORDER — MISOPROSTOL 25 MCG QUARTER TABLET
25.0000 ug | ORAL_TABLET | ORAL | Status: DC | PRN
  Administered 2021-11-28: 25 ug via VAGINAL
  Filled 2021-11-28: qty 1

## 2021-11-28 MED ORDER — OXYTOCIN BOLUS FROM INFUSION
333.0000 mL | Freq: Once | INTRAVENOUS | Status: AC
  Administered 2021-11-29: 333 mL via INTRAVENOUS

## 2021-11-28 MED ORDER — SOD CITRATE-CITRIC ACID 500-334 MG/5ML PO SOLN: 30.0000 mL | ORAL | Status: DC | PRN

## 2021-11-28 MED ORDER — TERBUTALINE SULFATE 1 MG/ML IJ SOLN
0.2500 mg | Freq: Once | INTRAMUSCULAR | Status: DC | PRN
Start: 2021-11-28 — End: 2021-11-29

## 2021-11-28 MED ORDER — LACTATED RINGERS IV SOLN
500.0000 mL | INTRAVENOUS | Status: DC | PRN
  Administered 2021-11-29: 500 mL via INTRAVENOUS

## 2021-11-28 MED ORDER — OXYCODONE-ACETAMINOPHEN 5-325 MG PO TABS: 2.0000 | ORAL_TABLET | ORAL | Status: DC | PRN

## 2021-11-28 MED ORDER — ONDANSETRON HCL 4 MG/2ML IJ SOLN
4.0000 mg | Freq: Four times a day (QID) | INTRAMUSCULAR | Status: DC | PRN
  Administered 2021-11-29: 4 mg via INTRAVENOUS
  Filled 2021-11-28: qty 2

## 2021-11-28 MED ORDER — OXYTOCIN-SODIUM CHLORIDE 30-0.9 UT/500ML-% IV SOLN: 2.5000 [IU]/h | INTRAVENOUS | Status: DC

## 2021-11-28 MED ORDER — LACTATED RINGERS IV SOLN: INTRAVENOUS | Status: DC

## 2021-11-28 MED ORDER — LIDOCAINE HCL (PF) 1 % IJ SOLN
30.0000 mL | INTRAMUSCULAR | Status: DC | PRN
Start: 2021-11-28 — End: 2021-11-29

## 2021-11-29 ENCOUNTER — Encounter (HOSPITAL_COMMUNITY): Payer: Self-pay | Admitting: Obstetrics and Gynecology

## 2021-11-29 ENCOUNTER — Inpatient Hospital Stay (HOSPITAL_COMMUNITY): Payer: Medicaid Other | Admitting: Certified Registered Nurse Anesthetist

## 2021-11-29 ENCOUNTER — Encounter (HOSPITAL_COMMUNITY)
Admission: AD | Disposition: A | Discharge status: Home / Self Care | Payer: Self-pay | Source: Home / Self Care | Attending: Obstetrics and Gynecology

## 2021-11-29 ENCOUNTER — Inpatient Hospital Stay (HOSPITAL_COMMUNITY): Payer: Medicaid Other | Admitting: Anesthesiology

## 2021-11-29 ENCOUNTER — Inpatient Hospital Stay (HOSPITAL_COMMUNITY): Payer: Medicaid Other

## 2021-11-29 HISTORY — PX: DILATION AND CURETTAGE OF UTERUS: SHX78

## 2021-11-29 LAB — RPR: RPR Ser Ql: NONREACTIVE

## 2021-11-29 SURGERY — DILATION AND CURETTAGE
Anesthesia: Regional

## 2021-11-29 MED ORDER — IBUPROFEN 600 MG PO TABS
600.0000 mg | ORAL_TABLET | Freq: Four times a day (QID) | ORAL | Status: DC
  Administered 2021-11-29 – 2021-12-01 (×8): 600 mg via ORAL
  Filled 2021-11-29 (×8): qty 1

## 2021-11-29 MED ORDER — DIPHENHYDRAMINE HCL 50 MG/ML IJ SOLN: 12.5000 mg | INTRAMUSCULAR | Status: DC | PRN

## 2021-11-29 MED ORDER — TETANUS-DIPHTH-ACELL PERTUSSIS 5-2.5-18.5 LF-MCG/0.5 IM SUSY: 0.5000 mL | PREFILLED_SYRINGE | Freq: Once | INTRAMUSCULAR | Status: AC

## 2021-11-29 MED ORDER — SODIUM CHLORIDE 0.9 % IV SOLN
3.0000 g | Freq: Four times a day (QID) | INTRAVENOUS | Status: AC
  Administered 2021-11-29 – 2021-11-30 (×4): 3 g via INTRAVENOUS
  Filled 2021-11-29 (×4): qty 8

## 2021-11-29 MED ORDER — LIDOCAINE-EPINEPHRINE (PF) 2 %-1:200000 IJ SOLN
INTRAMUSCULAR | Status: DC | PRN
  Administered 2021-11-29: 5 mL via EPIDURAL
  Administered 2021-11-29: 2 mL via EPIDURAL

## 2021-11-29 MED ORDER — OXYCODONE HCL 5 MG PO TABS
5.0000 mg | ORAL_TABLET | ORAL | Status: DC | PRN
  Administered 2021-12-01: 5 mg via ORAL
  Filled 2021-11-29: qty 1

## 2021-11-29 MED ORDER — ZOLPIDEM TARTRATE 5 MG PO TABS: 5.0000 mg | ORAL_TABLET | Freq: Every evening | ORAL | Status: DC | PRN

## 2021-11-29 MED ORDER — ONDANSETRON HCL 4 MG/2ML IJ SOLN: 4.0000 mg | INTRAMUSCULAR | Status: DC | PRN

## 2021-11-29 MED ORDER — BENZOCAINE-MENTHOL 20-0.5 % EX AERO
1.0000 | INHALATION_SPRAY | CUTANEOUS | Status: DC | PRN
  Administered 2021-11-30: 1 via TOPICAL
  Filled 2021-11-29: qty 56

## 2021-11-29 MED ORDER — FENTANYL CITRATE (PF) 100 MCG/2ML IJ SOLN
50.0000 ug | INTRAMUSCULAR | Status: DC | PRN
  Administered 2021-11-29: 50 ug via INTRAVENOUS
  Filled 2021-11-29: qty 2

## 2021-11-29 MED ORDER — SIMETHICONE 80 MG PO CHEW: 80.0000 mg | CHEWABLE_TABLET | ORAL | Status: DC | PRN

## 2021-11-29 MED ORDER — STERILE WATER FOR IRRIGATION IR SOLN
Status: DC | PRN
  Administered 2021-11-29: 1000 mL

## 2021-11-29 MED ORDER — OXYCODONE HCL 5 MG PO TABS: 10.0000 mg | ORAL_TABLET | ORAL | Status: DC | PRN

## 2021-11-29 MED ORDER — PRENATAL MULTIVITAMIN CH
1.0000 | ORAL_TABLET | Freq: Every day | ORAL | Status: DC
  Administered 2021-11-30 – 2021-12-01 (×2): 1 via ORAL
  Filled 2021-11-29 (×2): qty 1

## 2021-11-29 MED ORDER — WITCH HAZEL-GLYCERIN EX PADS
1.0000 | MEDICATED_PAD | CUTANEOUS | Status: DC | PRN
  Administered 2021-11-30: 1 via TOPICAL

## 2021-11-29 MED ORDER — FERROUS SULFATE 325 (65 FE) MG PO TABS
325.0000 mg | ORAL_TABLET | Freq: Two times a day (BID) | ORAL | Status: DC
  Administered 2021-11-29: 325 mg via ORAL
  Filled 2021-11-29: qty 1

## 2021-11-29 MED ORDER — DIBUCAINE (PERIANAL) 1 % EX OINT
1.0000 | TOPICAL_OINTMENT | CUTANEOUS | Status: DC | PRN
  Administered 2021-11-30: 1 via RECTAL
  Filled 2021-11-29: qty 28

## 2021-11-29 MED ORDER — MISOPROSTOL 25 MCG QUARTER TABLET
ORAL_TABLET | ORAL | Status: DC | PRN
  Administered 2021-11-29: 800 ug via RECTAL

## 2021-11-29 MED ORDER — ONDANSETRON HCL 4 MG PO TABS: 4.0000 mg | ORAL_TABLET | ORAL | Status: DC | PRN

## 2021-11-29 MED ORDER — DIPHENHYDRAMINE HCL 25 MG PO CAPS: 25.0000 mg | ORAL_CAPSULE | Freq: Four times a day (QID) | ORAL | Status: DC | PRN

## 2021-11-29 MED ORDER — FENTANYL-BUPIVACAINE-NACL 0.5-0.125-0.9 MG/250ML-% EP SOLN
EPIDURAL | Status: DC | PRN
  Administered 2021-11-29: 12 mL/h via EPIDURAL

## 2021-11-29 MED ORDER — KETOROLAC TROMETHAMINE 30 MG/ML IJ SOLN
30.0000 mg | Freq: Once | INTRAMUSCULAR | Status: AC
  Administered 2021-11-29: 30 mg via INTRAVENOUS

## 2021-11-29 MED ORDER — FENTANYL CITRATE (PF) 100 MCG/2ML IJ SOLN: 25.0000 ug | INTRAMUSCULAR | Status: DC | PRN

## 2021-11-29 MED ORDER — ACETAMINOPHEN 160 MG/5ML PO SOLN: 1000.0000 mg | Freq: Once | ORAL | Status: AC

## 2021-11-29 MED ORDER — MISOPROSTOL 200 MCG PO TABS
800.0000 ug | ORAL_TABLET | Freq: Once | ORAL | Status: AC
  Administered 2021-11-29: 800 ug via RECTAL

## 2021-11-29 MED ORDER — FENTANYL CITRATE (PF) 100 MCG/2ML IJ SOLN
INTRAMUSCULAR | Status: DC | PRN
  Administered 2021-11-29: 100 ug via EPIDURAL

## 2021-11-29 MED ORDER — FENTANYL CITRATE (PF) 100 MCG/2ML IJ SOLN
INTRAMUSCULAR | Status: AC
  Filled 2021-11-29: qty 2

## 2021-11-29 MED ORDER — COCONUT OIL OIL: 1.0000 | TOPICAL_OIL | Status: DC | PRN

## 2021-11-29 MED ORDER — LIDOCAINE-EPINEPHRINE (PF) 2 %-1:200000 IJ SOLN
INTRAMUSCULAR | Status: DC | PRN
  Administered 2021-11-29: 5 mL via INTRADERMAL
  Administered 2021-11-29: 2 mL via INTRADERMAL

## 2021-11-29 MED ORDER — FENTANYL CITRATE (PF) 100 MCG/2ML IJ SOLN
INTRAMUSCULAR | Status: AC
  Administered 2021-11-29: 100 ug via INTRAVENOUS
  Filled 2021-11-29: qty 2

## 2021-11-29 MED ORDER — ONDANSETRON HCL 4 MG/2ML IJ SOLN
INTRAMUSCULAR | Status: DC | PRN
  Administered 2021-11-29: 4 mg via INTRAVENOUS

## 2021-11-29 MED ORDER — EPHEDRINE 5 MG/ML INJ: 10.0000 mg | INTRAVENOUS | Status: DC | PRN

## 2021-11-29 MED ORDER — LACTATED RINGERS IV SOLN: INTRAVENOUS | Status: DC

## 2021-11-29 MED ORDER — FENTANYL CITRATE (PF) 100 MCG/2ML IJ SOLN: 100.0000 ug | Freq: Once | INTRAMUSCULAR | Status: AC

## 2021-11-29 MED ORDER — EPHEDRINE 5 MG/ML INJ
10.0000 mg | INTRAVENOUS | Status: DC | PRN
Start: 2021-11-29 — End: 2021-11-29

## 2021-11-29 MED ORDER — MISOPROSTOL 200 MCG PO TABS
ORAL_TABLET | ORAL | Status: AC
  Filled 2021-11-29: qty 4

## 2021-11-29 MED ORDER — OXYTOCIN-SODIUM CHLORIDE 30-0.9 UT/500ML-% IV SOLN: 2.5000 [IU]/h | INTRAVENOUS | Status: DC | PRN

## 2021-11-29 MED ORDER — FENTANYL-BUPIVACAINE-NACL 0.5-0.125-0.9 MG/250ML-% EP SOLN
12.0000 mL/h | EPIDURAL | Status: DC | PRN
  Filled 2021-11-29: qty 250

## 2021-11-29 MED ORDER — TERBUTALINE SULFATE 1 MG/ML IJ SOLN: 0.2500 mg | Freq: Once | INTRAMUSCULAR | Status: DC | PRN

## 2021-11-29 MED ORDER — KETOROLAC TROMETHAMINE 30 MG/ML IJ SOLN
INTRAMUSCULAR | Status: AC
  Filled 2021-11-29: qty 1

## 2021-11-29 MED ORDER — PHENYLEPHRINE 80 MCG/ML (10ML) SYRINGE FOR IV PUSH (FOR BLOOD PRESSURE SUPPORT)
80.0000 ug | PREFILLED_SYRINGE | INTRAVENOUS | Status: DC | PRN
  Filled 2021-11-29: qty 10

## 2021-11-29 MED ORDER — OXYTOCIN-SODIUM CHLORIDE 30-0.9 UT/500ML-% IV SOLN
1.0000 m[IU]/min | INTRAVENOUS | Status: DC
  Administered 2021-11-29: 2 m[IU]/min via INTRAVENOUS
  Administered 2021-11-29: 30 [IU] via INTRAVENOUS
  Filled 2021-11-29: qty 500

## 2021-11-29 MED ORDER — ACETAMINOPHEN 325 MG PO TABS
650.0000 mg | ORAL_TABLET | ORAL | Status: DC | PRN
  Administered 2021-11-30: 650 mg via ORAL
  Filled 2021-11-29: qty 2

## 2021-11-29 MED ORDER — LACTATED RINGERS IV SOLN
500.0000 mL | Freq: Once | INTRAVENOUS | Status: AC
  Administered 2021-11-29: 500 mL via INTRAVENOUS

## 2021-11-29 MED ORDER — ACETAMINOPHEN 500 MG PO TABS
ORAL_TABLET | ORAL | Status: AC
  Filled 2021-11-29: qty 2

## 2021-11-29 MED ORDER — DROPERIDOL 2.5 MG/ML IJ SOLN: 0.6250 mg | Freq: Once | INTRAMUSCULAR | Status: DC | PRN

## 2021-11-29 MED ORDER — PHENYLEPHRINE 80 MCG/ML (10ML) SYRINGE FOR IV PUSH (FOR BLOOD PRESSURE SUPPORT): 80.0000 ug | PREFILLED_SYRINGE | INTRAVENOUS | Status: DC | PRN

## 2021-11-29 MED ORDER — SENNOSIDES-DOCUSATE SODIUM 8.6-50 MG PO TABS
2.0000 | ORAL_TABLET | Freq: Every day | ORAL | Status: DC
  Administered 2021-11-30 – 2021-12-01 (×2): 2 via ORAL
  Filled 2021-11-29 (×2): qty 2

## 2021-11-29 MED ORDER — ACETAMINOPHEN 500 MG PO TABS
1000.0000 mg | ORAL_TABLET | Freq: Once | ORAL | Status: AC
  Administered 2021-11-29: 1000 mg via ORAL

## 2021-11-29 MED ORDER — LIDOCAINE HCL (PF) 1 % IJ SOLN
INTRAMUSCULAR | Status: DC | PRN
  Administered 2021-11-29 (×2): 4 mL via EPIDURAL

## 2021-11-29 SURGICAL SUPPLY — 2 items
SUT VIC AB 2-0 CT1 (SUTURE) IMPLANT
SUT VIC AB 4-0 PS2 27 (SUTURE) IMPLANT

## 2021-11-29 NOTE — H&P (Signed)
31 y.o. [redacted]w[redacted]d G2P1001 comes in c/o for scheduled elective IOL at term.  Otherwise has good fetal movement and no bleeding.  Past Medical History:  Diagnosis Date   Hepatitis B    Medical history non-contributory    Tattoo 09/28/2019    Past Surgical History:  Procedure Laterality Date   NO PAST SURGERIES      OB History  Gravida Para Term Preterm AB Living  '2 1 1 '$ 0 0 1  SAB IAB Ectopic Multiple Live Births  0 0 0 0 1    # Outcome Date GA Lbr Len/2nd Weight Sex Delivery Anes PTL Lv  2 Current           1 Term 08/16/19 [id number]:44 / 00:24 2821 g M Vag-Spont EPI  LIV    Social History   Socioeconomic History   Marital status: Single    Spouse name: KeVilinda Boehringer Number of children: 0   Years of education: Highschool grad   Highest education level: 12th grade  Occupational History   Not on file  Tobacco Use   Smoking status: Never   Smokeless tobacco: Never  Vaping Use   Vaping Use: Never used  Substance and Sexual Activity   Alcohol use: Not Currently    Comment: Socillay   Drug use: Never   Sexual activity: Yes  Other Topics Concern   Not on file  Social History Narrative   Not on file   Social Determinants of Health   Financial Resource Strain: Not on file  Food Insecurity: Not on file  Transportation Needs: Not on file  Physical Activity: Not on file  Stress: Not on file  Social Connections: Not on file  Intimate Partner Violence: Not on file   Patient has no known allergies.    Prenatal Transfer Tool  Maternal Diabetes: No Genetic Screening: Normal Maternal Ultrasounds/Referrals: Normal Fetal Ultrasounds or other Referrals:  None Maternal Substance Abuse:  No Significant Maternal Medications:  None Significant Maternal Lab Results: Group B Strep negative  Other PNC: chronic Hep B, normal LFTs 11weeks and 28 weeks    Vitals:   11/28/21 2236 11/28/21 2249  Temp:  98.8 F (37.1 C)  TempSrc:  Oral  Weight: 68.4 kg   Height: '5\' 1"'$  (1.549 m)      Lungs/Cor:  NAD Abdomen:  soft, gravid Ex:  no cords, erythema SVE:  1.5/50/-2 FHTs:  initially 140 Cat 1, recently RN alerted me to 3 min decel Toco: 3-4   A/P   Admit G2P1001 @ 40.1 for elective IOL  Cytotec for cervical ripening, any further decels and will d/c further cytotec and switch to pitocin when needed  GBS Neg    Krystal Lloyd

## 2021-11-29 NOTE — Op Note (Addendum)
Operative Note    Preoperative Diagnosis Retained placenta after delivery Second degree laceration Right labial laceration   Postoperative Diagnosis Same Adherent placenta Second degree laceration Right labial laceration   Procedure: Ultrasound guided currettage of uterus to removed retained placenta and repair of vaginal and labial laceration    Surgeon: Mickle Mallory, DO   Anesthesia: Epidural   Fluids: LR 849m EBL: 1664m( total including EBL from delivery = 69145mUOP: 125m25mFindings: Large amount of retained placental products in fundus of uterus which was sharply anteverted   Specimen: Placental products    Procedure Note  Consent was confirmed from  pt. Patient was taken to the operating room where epidural anesthesia was dosed up and noted to be adequate for procedure. She was then prepped and draped in the normal sterile fashion while in the dorsal lithotomy position. A foley catheter was placed for bladder decompression. An appropriate time out was performed.  A closure exam noted a second degree right perineal and labial laceration were noted apparently from the delivery. A speculum was then placed within the vagina. Ultrasound was used to evaluate the uterus and confirmed imaging of products of conception in the fundal area of the uterus noted.  Using a hunter currette, the products were evacuated with several passes. There was a large amount of products mostly from the fairly anteverted fundus. Bleeding was mild. Pitocin was restarted per post delivery protocol.  Uterus was noted to be firm however 800mc71m rectal cytotec was placed to further effect evacuation and hemostasis.  Next the second degree laceration was repaired using 2-0 vicryl suture. Then the labial laceration and perineal skin were repaired using 4-0 vicryl on an SH needle.  Cervix intact Pt tolerated the procedure with no complaints or complications.  Counts were confirmed to be correct per  nursing staff x 2  Pt was then taken to recovery room in stable position.

## 2021-11-29 NOTE — Lactation Note (Signed)
This note was copied from a baby's chart. Lactation Consultation Note  Patient Name: Krystal Lloyd Today's Date: 11/29/2021 Age:30 hours  Birthing parent and infant are sleeping upon visit. LC will come back to room at another time as possible.     Kordel Leavy A Higuera Ancidey 11/29/2021, 2:47 PM

## 2021-11-29 NOTE — Lactation Note (Signed)
This note was copied from a baby's chart. Lactation Consultation Note  Patient Name: Krystal Lloyd Today's Date: 11/29/2021 Reason for consult: Initial assessment;Term Age:30 hours  Initial visit to 20 hours old infant. Birthing parent reports feeding infant with 105m of expressed milk. Birthing parent collected ~14-mL, 7-mL are in refrigerator.  Discussed normal newborn behavior and patterns, signs of good milk transfer, hunger cues, tummy size and benefits of skin to skin.  Parent is using personal pump.  Plan: 1-Feeding on demand or 8-12 times in 24h period. 2-Use pump as needed 3-Encouraged birthing parent rest, hydration and food intake.   Contact LC as needed for feeds/support/concerns/questions. All questions answered at this time. Provided Lactation services brochure.  Maternal Data Has patient been taught Hand Expression?: No Does the patient have breastfeeding experience prior to this delivery?: No  Feeding Mother's Current Feeding Choice: Breast Milk Nipple Type: Extra Slow Flow  LATCH Score Latch: Repeated attempts needed to sustain latch, nipple held in mouth throughout feeding, stimulation needed to elicit sucking reflex.  Audible Swallowing: A few with stimulation  Type of Nipple: Everted at rest and after stimulation  Comfort (Breast/Nipple): Soft / non-tender  Hold (Positioning): Assistance needed to correctly position infant at breast and maintain latch.  LATCH Score: 7   Lactation Tools Discussed/Used Tools: Pump (personal) Breast pump type: Double-Electric Breast Pump (personal pump) Reason for Pumping: mother's choice Pumping frequency: mother's preference Pumped volume: 14 mL  Interventions Interventions: Breast feeding basics reviewed;DEBP;Education;Expressed milk;LC Services brochure  Discharge Pump: Personal WIC Program: Yes  Consult Status Consult Status: Follow-up Date: 11/30/21 Follow-up type: In-patient    Kamayah Pillay A Higuera  [place] 11/29/2021, 7:18 PM

## 2021-11-29 NOTE — Anesthesia Preprocedure Evaluation (Signed)
Anesthesia Evaluation  Patient identified by MRN, date of birth, ID band Patient awake    Reviewed: Allergy & Precautions, Patient's Chart, lab work & pertinent test results  History of Anesthesia Complications Negative for: history of anesthetic complications  Airway Mallampati: II  TM Distance: >3 FB Neck ROM: Full    Dental no notable dental hx.    Pulmonary neg pulmonary ROS,    Pulmonary exam normal        Cardiovascular negative cardio ROS Normal cardiovascular exam     Neuro/Psych negative neurological ROS  negative psych ROS   GI/Hepatic negative GI ROS, (+) Hepatitis -, B  Endo/Other  negative endocrine ROS  Renal/GU negative Renal ROS  negative genitourinary   Musculoskeletal negative musculoskeletal ROS (+)   Abdominal   Peds  Hematology negative hematology ROS (+)   Anesthesia Other Findings Day of surgery medications reviewed with patient.  Reproductive/Obstetrics (+) Pregnancy                             Anesthesia Physical Anesthesia Plan  ASA: 2  Anesthesia Plan: Epidural   Post-op Pain Management:    Induction:   PONV Risk Score and Plan: Treatment may vary due to age or medical condition  Airway Management Planned: Natural Airway  Additional Equipment: Fetal Monitoring  Intra-op Plan:   Post-operative Plan:   Informed Consent: I have reviewed the patients History and Physical, chart, labs and discussed the procedure including the risks, benefits and alternatives for the proposed anesthesia with the patient or authorized representative who has indicated his/her understanding and acceptance.       Plan Discussed with:   Anesthesia Plan Comments:         Anesthesia Quick Evaluation

## 2021-11-29 NOTE — Anesthesia Postprocedure Evaluation (Signed)
Anesthesia Post Note  Patient: Krystal Lloyd  Procedure(s) Performed: DILATATION AND CURETTAGE     Patient location during evaluation: PACU Anesthesia Type: Epidural Level of consciousness: awake and alert Pain management: pain level controlled Vital Signs Assessment: post-procedure vital signs reviewed and stable Respiratory status: spontaneous breathing, nonlabored ventilation and respiratory function stable Cardiovascular status: blood pressure returned to baseline Postop Assessment: epidural receding, no apparent nausea or vomiting, no headache and no backache Anesthetic complications: no   No notable events documented.  Last Vitals:  Vitals:   11/29/21 1000 11/29/21 1015  BP: 118/84 112/65  Pulse: 71 71  Resp: 14 16  Temp:    SpO2: 99% 98%    Last Pain:  Vitals:   11/29/21 1015  TempSrc:   PainSc: 0-No pain   Pain Goal:    LLE Motor Response: Purposeful movement (11/29/21 1015) LLE Sensation: Numbness (11/29/21 1015) RLE Motor Response: Purposeful movement (11/29/21 1015) RLE Sensation: Tingling (11/29/21 1015)     Epidural/Spinal Function Cutaneous sensation: Vague (11/29/21 1015), Patient able to flex knees: Yes (11/29/21 1015), Patient able to lift hips off bed: Yes (11/29/21 1015), Back pain beyond tenderness at insertion site: No (11/29/21 1015), Progressively worsening motor and/or sensory loss: No (11/29/21 1015), Bowel and/or bladder incontinence post epidural: No (11/29/21 1015)  Marthenia Rolling

## 2021-11-29 NOTE — Anesthesia Preprocedure Evaluation (Signed)
Anesthesia Evaluation  Patient identified by MRN, date of birth, ID band Patient awake    Reviewed: Allergy & Precautions, NPO status , Patient's Chart, lab work & pertinent test results  History of Anesthesia Complications Negative for: history of anesthetic complications  Airway Mallampati: II  TM Distance: >3 FB Neck ROM: Full    Dental no notable dental hx.    Pulmonary neg pulmonary ROS,    Pulmonary exam normal        Cardiovascular negative cardio ROS Normal cardiovascular exam     Neuro/Psych negative neurological ROS  negative psych ROS   GI/Hepatic negative GI ROS, (+) Hepatitis -, B  Endo/Other  negative endocrine ROS  Renal/GU negative Renal ROS  negative genitourinary   Musculoskeletal negative musculoskeletal ROS (+)   Abdominal   Peds  Hematology negative hematology ROS (+)   Anesthesia Other Findings Day of surgery medications reviewed with patient.  Reproductive/Obstetrics Retained placenta                             Anesthesia Physical Anesthesia Plan  ASA: 2 and emergent  Anesthesia Plan: Epidural   Post-op Pain Management:    Induction:   PONV Risk Score and Plan: Treatment may vary due to age or medical condition  Airway Management Planned: Natural Airway  Additional Equipment: Fetal Monitoring  Intra-op Plan:   Post-operative Plan:   Informed Consent: I have reviewed the patients History and Physical, chart, labs and discussed the procedure including the risks, benefits and alternatives for the proposed anesthesia with the patient or authorized representative who has indicated his/her understanding and acceptance.       Plan Discussed with: CRNA  Anesthesia Plan Comments: (To OR for D&C, retained placenta. Plan to use existing labor epidural. Daiva Huge, MD)        Anesthesia Quick Evaluation

## 2021-11-29 NOTE — Transfer of Care (Signed)
Immediate Anesthesia Transfer of Care Note  Patient: Krystal Lloyd  Procedure(s) Performed: DILATATION AND CURETTAGE  Patient Location: PACU  Anesthesia Type:Epidural  Level of Consciousness: awake, alert  and oriented  Airway & Oxygen Therapy: Patient Spontanous Breathing  Post-op Assessment: Report given to RN and Post -op Vital signs reviewed and stable  Post vital signs: Reviewed and stable  Last Vitals:  Vitals Value Taken Time  BP 106/59 11/29/21 0950  Temp 36.3 C 11/29/21 0950  Pulse 69 11/29/21 0954  Resp 14 11/29/21 0954  SpO2 98 % 11/29/21 0954  Vitals shown include unvalidated device data.  Last Pain:  Vitals:   11/29/21 0950  TempSrc: Oral  PainSc:          Complications: No notable events documented.

## 2021-11-29 NOTE — Anesthesia Procedure Notes (Signed)
Epidural Patient location during procedure: OB Start time: 11/29/2021 7:44 AM End time: 11/29/2021 7:47 AM  Staffing Anesthesiologist: Brennan Bailey, MD Performed: anesthesiologist   Preanesthetic Checklist Completed: patient identified, IV checked, risks and benefits discussed, monitors and equipment checked, pre-op evaluation and timeout performed  Epidural Patient position: sitting Prep: DuraPrep and site prepped and draped Patient monitoring: continuous pulse ox, blood pressure and heart rate Approach: midline Location: L3-L4 Injection technique: LOR air  Needle:  Needle type: Tuohy  Needle gauge: 17 G Needle length: 9 cm Needle insertion depth: 5 cm Catheter type: closed end flexible Catheter size: 19 Gauge Catheter at skin depth: 10 cm Test dose: negative and Other (1% lidocaine)  Assessment Events: blood not aspirated, injection not painful, no injection resistance, no paresthesia and negative IV test  Additional Notes Patient identified. Risks, benefits, and alternatives discussed with patient including but not limited to bleeding, infection, nerve damage, paralysis, failed block, incomplete pain control, headache, blood pressure changes, nausea, vomiting, reactions to medication, itching, and postpartum back pain. Confirmed with bedside nurse the patient's most recent platelet count. Confirmed with patient that they are not currently taking any anticoagulation, have any bleeding history, or any family history of bleeding disorders. Patient expressed understanding and wished to proceed. All questions were answered. Sterile technique was used throughout the entire procedure. Please see nursing notes for vital signs.   Crisp LOR on first pass. Test dose was given through epidural catheter and negative prior to continuing to dose epidural or start infusion. Warning signs of high block given to the patient including shortness of breath, tingling/numbness in hands, complete  motor block, or any concerning symptoms with instructions to call for help. Patient was given instructions on fall risk and not to get out of bed. All questions and concerns addressed with instructions to call with any issues or inadequate analgesia.  Reason for block:procedure for pain

## 2021-11-30 LAB — CBC
HCT: 26.5 % — ABNORMAL LOW (ref 36.0–46.0)
Hemoglobin: 8.9 g/dL — ABNORMAL LOW (ref 12.0–15.0)
MCH: 30.1 pg (ref 26.0–34.0)
MCHC: 33.6 g/dL (ref 30.0–36.0)
MCV: 89.5 fL (ref 80.0–100.0)
Platelets: 125 10*3/uL — ABNORMAL LOW (ref 150–400)
RBC: 2.96 MIL/uL — ABNORMAL LOW (ref 3.87–5.11)
RDW: 13.4 % (ref 11.5–15.5)
WBC: 10.6 10*3/uL — ABNORMAL HIGH (ref 4.0–10.5)
nRBC: 0 % (ref 0.0–0.2)

## 2021-11-30 MED ORDER — FERROUS SULFATE 325 (65 FE) MG PO TABS
325.0000 mg | ORAL_TABLET | Freq: Every day | ORAL | Status: DC
  Administered 2021-11-30 – 2021-12-01 (×2): 325 mg via ORAL
  Filled 2021-11-30 (×2): qty 1

## 2021-11-30 NOTE — Plan of Care (Signed)
  Problem: Activity: Goal: Will verbalize the importance of balancing activity with adequate rest periods Outcome: Completed/Met   Problem: Life Cycle: Goal: Chance of risk for complications during the postpartum period will decrease Outcome: Completed/Met

## 2021-11-30 NOTE — Anesthesia Postprocedure Evaluation (Signed)
Anesthesia Post Note  Patient: Marivel Labree  Procedure(s) Performed: AN AD HOC LABOR EPIDURAL     Patient location during evaluation: Mother Baby Anesthesia Type: Epidural Level of consciousness: awake and alert and oriented Pain management: satisfactory to patient Vital Signs Assessment: post-procedure vital signs reviewed and stable Respiratory status: respiratory function stable Cardiovascular status: stable Postop Assessment: no headache, no backache, epidural receding, patient able to bend at knees, no signs of nausea or vomiting, adequate PO intake and able to ambulate Anesthetic complications: no   No notable events documented.  Last Vitals:  Vitals:   11/29/21 2339 11/30/21 0300  BP: 112/62 (!) 108/56  Pulse: 65 62  Resp: 18 18  Temp: 36.8 C 36.7 C  SpO2:      Last Pain:  Vitals:   11/30/21 0300  TempSrc: Oral  PainSc:    Pain Goal:                   Geddy [place]

## 2021-11-30 NOTE — Progress Notes (Signed)
Post Partum Day 1 Subjective: Doing well without complaints. Ambulating, voiding, tolerating Po. Minimal lochia.   Objective: Patient Vitals for the past 24 hrs:  BP Temp Temp src Pulse Resp SpO2  11/30/21 0300 (!) 108/56 98 F (36.7 C) Oral 62 18 --  11/29/21 2339 112/62 98.3 F (36.8 C) Oral 65 18 --  11/29/21 2001 106/77 98.7 F (37.1 C) Oral 67 18 98 %  11/29/21 1618 108/61 98 F (36.7 C) -- 68 17 98 %    Physical Exam:  General: alert, cooperative, and no distress Lochia: appropriate Uterine Fundus: firm DVT Evaluation: No evidence of DVT seen on physical exam.  Recent Labs    11/28/21 2241 11/30/21 0500  WBC 9.7 10.6*  HGB 11.5* 8.9*  HCT 33.8* 26.5*  PLT 183 125*    No results for input(s): "NA", "K", "CL", "CO2CT", "BUN", "CREATININE", "GLUCOSE", "BILITOT", "ALT", "AST", "ALKPHOS", "PROT", "ALBUMIN" in the last 72 hours.  No results for input(s): "CALCIUM", "MG", "PHOS" in the last 72 hours.  No results for input(s): "PROTIME", "APTT", "INR" in the last 72 hours.  No results for input(s): "PROTIME", "APTT", "INR", "FIBRINOGEN" in the last 72 hours. Assessment/Plan:  Krystal Lloyd 31 y.o. [id number] PPD#1 sp SVD and POD#1 s/p D&C for retained placenta 1. PPC: routine PP care 2. ABLA, total EBL 656m: Hgb 8.9, PO iron ordered 3. Rh pos 4. Dispo: anticipate discharge home tomorrow   LOS: 2 days   MRowland Lathe9/05/2021, 2:18 PM

## 2021-12-01 MED ORDER — IBUPROFEN 200 MG PO TABS: 600.0000 mg | ORAL_TABLET | Freq: Four times a day (QID) | ORAL | Status: AC | PRN

## 2021-12-01 MED ORDER — FERROUS SULFATE 325 (65 FE) MG PO TABS: 325.0000 mg | ORAL_TABLET | Freq: Every day | ORAL | 3 refills | Status: AC

## 2021-12-01 MED ORDER — ACETAMINOPHEN 325 MG PO TABS: 650.0000 mg | ORAL_TABLET | Freq: Four times a day (QID) | ORAL | Status: AC | PRN

## 2021-12-01 NOTE — Discharge Summary (Signed)
Postpartum Discharge Summary  Date of Service updated 12/01/21      Patient Name: Krystal Lloyd DOB: 04-23-1990 MRN: [id number]  Date of admission: 11/28/2021 Delivery date:11/29/2021  Delivering provider: Deloris Ping  Date of discharge: 12/01/2021  Admitting diagnosis: Term pregnancy [Z34.90] Intrauterine pregnancy: [redacted]w[redacted]d    Secondary diagnosis:  Principal Problem:   Term pregnancy Active Problems:   Retained products of conception after delivery without hemorrhage  Additional problems: none    Discharge diagnosis: Term Pregnancy Delivered and Anemia                                              Post partum procedures:curettage  Augmentation: Pitocin and Cytotec Complications: Retained placenta requiring D&C in OR for removal  Hospital course: Induction of Labor With Vaginal Delivery   31y.o. yo G2P2002 at 431w1das admitted to the hospital 11/28/2021 for induction of labor.  Indication for induction: Elective.  Patient had an uncomplicated labor course as follows: Membrane Rupture Time/Date: 8:29 AM ,11/29/2021   Delivery Method:Vaginal, Spontaneous  Episiotomy: None  Lacerations:  2nd degree  Details of delivery can be found in separate delivery note.  Retained placenta removed in OR by D&C. Afterwards, patient had a routine postpartum course. Patient is discharged home 12/01/21.  Newborn Data: Birth date:11/29/2021  Birth time:8:31 AM  Gender:Female  Living status:Living  Apgars:9 ,9  Weight:2970 g   Magnesium Sulfate received: No BMZ received: No Rhophylac:N/A MMR:No T-DaP:Given prenatally Flu: No Transfusion:No  Physical exam  Vitals:   11/30/21 0300 11/30/21 1809 11/30/21 2122 12/01/21 0607  BP: (!) 108/56 107/62 109/62 112/67  Pulse: 62 70 65 (!) 54  Resp: 18 16 18 18   Temp: 98 F (36.7 C) (!) 97.5 F (36.4 C) 98.2 F (36.8 C) (!) 97.5 F (36.4 C)  TempSrc: Oral Oral Oral Oral  SpO2:      Weight:      Height:       General: alert,  cooperative, and no distress Lochia: appropriate Uterine Fundus: firm Incision: N/A DVT Evaluation: No evidence of DVT seen on physical exam. Labs: Lab Results  Component Value Date   WBC 10.6 (H) 11/30/2021   HGB 8.9 (L) 11/30/2021   HCT 26.5 (L) 11/30/2021   MCV 89.5 11/30/2021   PLT 125 (L) 11/30/2021      Latest Ref Rng & Units 09/28/2019    2:11 PM  CMP  Glucose 65 - 99 mg/dL 81   BUN 7 - 25 mg/dL 4   Creatinine 0.50 - 1.10 mg/dL 0.59   Sodium 135 - 146 mmol/L 139   Potassium 3.5 - 5.3 mmol/L 3.9   Chloride 98 - 110 mmol/L 105   CO2 20 - 32 mmol/L 27   Calcium 8.6 - 10.2 mg/dL 9.5   Total Protein 6.1 - 8.1 g/dL 7.4   Total Bilirubin 0.2 - 1.2 mg/dL 0.6   AST 10 - 30 U/L 21   ALT 6 - 29 U/L 9    Edinburgh Score:    11/30/2021   12:38 AM  Edinburgh Postnatal Depression Scale Screening Tool  I have been able to laugh and see the funny side of things. 0  I have looked forward with enjoyment to things. 0  I have blamed myself unnecessarily when things went wrong. 1  I have been anxious or worried for  no good reason. 0  I have felt scared or panicky for no good reason. 0  Things have been getting on top of me. 1  I have been so unhappy that I have had difficulty sleeping. 0  I have felt sad or miserable. 0  I have been so unhappy that I have been crying. 0  The thought of harming myself has occurred to me. 0  Edinburgh Postnatal Depression Scale Total 2      After visit meds:  Allergies as of 12/01/2021   No Known Allergies      Medication List     TAKE these medications    acetaminophen 325 MG tablet Commonly known as: Tylenol Take 2 tablets (650 mg total) by mouth every 6 (six) hours as needed (for pain scale < 4).   ferrous sulfate 325 (65 FE) MG tablet Take 1 tablet (325 mg total) by mouth daily with breakfast.   ibuprofen 200 MG tablet Commonly known as: ADVIL Take 3 tablets (600 mg total) by mouth every 6 (six) hours as needed.   PRENATAL  VITAMIN PO Take by mouth.         Discharge home in stable condition Infant Feeding: Bottle and Breast Infant Disposition:home with mother Discharge instruction: per After Visit Summary and Postpartum booklet. Activity: Advance as tolerated. Pelvic rest for 6 weeks.  Diet: iron rich diet Anticipated Birth Control: Unsure Postpartum Appointment:4 weeks Additional Postpartum F/U:  none Future Appointments:No future appointments. Follow up Visit:  Follow-up Information     Ob/Gyn, [facility]. Schedule an appointment as soon as possible for a visit in 4 week(s).   Contact information: [address] [place] [address] [phone]                     12/01/2021 Rowland Lathe, MD

## 2021-12-01 NOTE — Lactation Note (Signed)
This note was copied from a baby's chart. Lactation Consultation Note  Patient Name: Girl Katiana Ruland PHXTA'V Date: 12/01/2021 Reason for consult: Follow-up assessment Age:30 hours  P2: Term infant at 40+1 weeks Feeding preference: Breast/formula  Baby was swaddled and asleep when I arrived; discharge order has been placed.  Birth parent has been breast feeding and supplementing with formula.  Last LATCH score was an 8; voiding/stooling.  Stools are yellow and seedy now.  Parents have no questions/concerns.  Op phone number given for any concerns after discharge.    Maternal Data    Feeding Nipple Type: Slow - flow  LATCH Score                    Lactation Tools Discussed/Used    Interventions    Discharge Discharge Education: Engorgement and breast care  Consult Status Consult Status: Complete Date: 12/01/21 Follow-up type: Call as needed    Ha Shannahan R Leasia Swann 12/01/2021, 11:47 AM

## 2021-12-01 NOTE — Progress Notes (Signed)
Post Partum Day 2 Subjective: Doing well without complaints. Ambulating, voiding, tolerating Po. Minimal lochia.   Objective: Patient Vitals for the past 24 hrs:  BP Temp Temp src Pulse Resp  12/01/21 0607 112/67 (!) 97.5 F (36.4 C) Oral (!) 54 18  11/30/21 2122 109/62 98.2 F (36.8 C) Oral 65 18  11/30/21 1809 107/62 (!) 97.5 F (36.4 C) Oral 70 16    Physical Exam:  General: alert, cooperative, and no distress Lochia: appropriate Uterine Fundus: firm DVT Evaluation: No evidence of DVT seen on physical exam.  Recent Labs    11/28/21 2241 11/30/21 0500  WBC 9.7 10.6*  HGB 11.5* 8.9*  HCT 33.8* 26.5*  PLT 183 125*    No results for input(s): "NA", "K", "CL", "CO2CT", "BUN", "CREATININE", "GLUCOSE", "BILITOT", "ALT", "AST", "ALKPHOS", "PROT", "ALBUMIN" in the last 72 hours.  No results for input(s): "CALCIUM", "MG", "PHOS" in the last 72 hours.  No results for input(s): "PROTIME", "APTT", "INR" in the last 72 hours.  No results for input(s): "PROTIME", "APTT", "INR", "FIBRINOGEN" in the last 72 hours. Assessment/Plan:  Krystal Lloyd 31 y.o. G2P2002 PPD#2 sp SVD and POD#1 s/p D&C for retained placenta 1. PPC: routine PP care 2. ABLA, total EBL 660m: Hgb 8.9, continue PO iron and encouraged iron rich diet 3. Rh pos 4. Dispo: discharge home, reviewed discharge instructions     LOS: 3 days   MRowland Lathe9/05/2021, 12:31 PM

## 2021-12-03 LAB — SURGICAL PATHOLOGY

## 2021-12-11 ENCOUNTER — Telehealth (HOSPITAL_COMMUNITY): Payer: Self-pay | Admitting: *Deleted

## 2021-12-11 NOTE — Telephone Encounter (Signed)
Voicemail not setup. Unable to leave message.  Odis Hollingshead, RN 12-11-2021 at 1:21pm

## 2023-03-04 NOTE — Therapy (Signed)
OUTPATIENT PHYSICAL THERAPY THORACOLUMBAR EVALUATION   Patient Name: Krystal Lloyd MRN: [id number] DOB:11/03/1990, 32 y.o., female Today's Date: 03/09/2023  END OF SESSION:  PT End of Session - 03/09/23 1357     Visit Number 1    Date for PT Re-Evaluation 05/18/23    PT Start Time 1148    PT Stop Time 1228    PT Time Calculation (min) 40 min    Activity Tolerance Patient tolerated treatment well    Behavior During Therapy [facility] for tasks assessed/performed            Past Medical History:  Diagnosis Date   Hepatitis B    Medical history non-contributory    Tattoo 09/28/2019   Past Surgical History:  Procedure Laterality Date   DILATION AND CURETTAGE OF UTERUS N/A 11/29/2021   Procedure: DILATATION AND CURETTAGE;  Surgeon: Edwinna Areola, DO;  Location: [facility] LD ORS;  Service: Gynecology;  Laterality: N/A;   NO PAST SURGERIES     Patient Active Problem List   Diagnosis Date Noted   Retained products of conception after delivery without hemorrhage 11/29/2021   Term pregnancy 11/28/2021   Tattoo 09/28/2019   Chronic viral hepatitis B without delta-agent (HCC) 04/13/2019    PCP: None  REFERRING PROVIDER: Delfin Gant, MD   REFERRING DIAG: M54.50 (ICD-10-CM) - Low back pain, unspecified   Rationale for Evaluation and Treatment: Rehabilitation  THERAPY DIAG:  Abnormal posture  Muscle weakness (generalized)  Other lack of coordination  Sacroiliac joint pain  ONSET DATE: 02/23/23  SUBJECTIVE:                                                                                                                                                                                           SUBJECTIVE STATEMENT: Patient reports that she has been having 3 month H/O R hip pain which shoots down into her leg. Worse at night.  PERTINENT HISTORY:  Per referring physician note: 2 month H/O LBP with R leg sciatica, worse when lying down,   PAIN:  Are you having pain? Yes:  NPRS scale: 10/10 Pain location: R hip and into R leg Pain description: shooting, nerve pain Aggravating factors: Wakes her up at night, lying down Relieving factors: None, Ibuprofen helps a little.  PRECAUTIONS: Back  RED FLAGS: None   WEIGHT BEARING RESTRICTIONS: No  FALLS:  Has patient fallen in last 6 months? 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  PLOF: Independent  PATIENT GOALS: Patient  would like to get relief from her pain and sleep without pain.  NEXT MD VISIT: none scheduled  OBJECTIVE:  Note: Objective measures were completed at Evaluation unless otherwise noted.  DIAGNOSTIC FINDINGS:  Had Xray, but not able to see  COGNITION: Overall cognitive status: Within functional limits for tasks assessed     SENSATION: Patient reports T & N in R leg.  MUSCLE LENGTH: Hamstrings: Right 80 deg; Left 80 deg Thomas test: Right neutral deg; Left neutral deg  POSTURE: increased lumbar lordosis, decreased thoracic kyphosis, and left pelvic obliquity  PALPATION: No TTP in either hips or gluts. TTP over R SI joint  LUMBAR ROM: TBD  AROM eval  Flexion   Extension   Right lateral flexion   Left lateral flexion   Right rotation   Left rotation    (Blank rows = not tested)  LOWER EXTREMITY ROM: WNL in all planes, R hip IR/ER greater than L.  LOWER EXTREMITY MMT:  Patient with overall weakness, consistently (4-)-4/5 throughout hips, knees, ankles.  LUMBAR SPECIAL TESTS:  Straight leg raise test: Negative, Slump test: Positive, and FABER test: Positive  SI ASSESSMENT: L pelvic obliquity, R iliac crest, ASIS, PSIS all lower than L, (+) for pain with R iliac post and ant rotation, unchanged with repetition. R leg length < 1/4" shorter than L  FUNCTIONAL TESTS:    GAIT: Distance walked: In clinic  distances Assistive device utilized: None Level of assistance: Complete Independence Comments: No obvious deviations noted.  TODAY'S TREATMENT:                                                                                                                              DATE:  03/09/23 Education  PATIENT EDUCATION:  Education details: POC, HEP Person educated: Patient Education method: Explanation Education comprehension: verbalized understanding  HOME EXERCISE PROGRAM: [id number]  ASSESSMENT:  CLINICAL IMPRESSION: Patient is a 32 y.o. who was seen today for physical therapy evaluation and treatment for 3 month H/O R hip pain with radiation into RLE. Her pain is worst at night, waking her up at times. She shows no muscle spasm in her low back or hips. She does have some TTP over her R SI joint. She demonstrates a very mild true LL discrepancy, and also shows pelvic inferior shift on R. She shows overall trunk and LE weakness, which is probably contributing to her instability. She was also (+) for sciatic neural tensioning. SI screening showed pain on the R with any challenge. She will benefit from PT to further assess her SI joint as well as build some strength and stability in her core and LE. Educated her to try placing a pillow between knees at night to decrease strain on her hip/SI joint.  OBJECTIVE IMPAIRMENTS: Abnormal gait, decreased activity tolerance, decreased mobility, difficulty walking, decreased ROM, decreased strength, increased fascial restrictions, increased muscle spasms, impaired flexibility, improper body mechanics, postural dysfunction, and pain.   ACTIVITY LIMITATIONS:  carrying, lifting, bending, squatting, sleeping, stairs, and locomotion level  PARTICIPATION LIMITATIONS: meal prep, cleaning, laundry, shopping, community activity, and occupation  PERSONAL FACTORS: Past/current experiences are also affecting patient's functional outcome.   REHAB POTENTIAL:  Good  CLINICAL DECISION MAKING: Stable/uncomplicated  EVALUATION COMPLEXITY: Low   GOALS: Goals reviewed with patient? Yes  SHORT TERM GOALS: Target date: 03/30/23  I with initial HEP Baseline: Goal status: INITIAL  LONG TERM GOALS: Target date: 05/18/23  I with final HEP Baseline:  Goal status: INITIAL  2.  Patient will perform sciatic neural tensioning with no pain Baseline:  Goal status: INITIAL  3.  Patient will report the ability to sleep through the night without being awakened by R hip and/or leg pain Baseline:  Goal status: INITIAL  4.  Increase B hip strength to 4+/5 Baseline:  Goal status: INITIAL  5.  Patient will perform SLS on either leg x 30 sec including gentle forward/back swing of opposite leg. Baseline:  Goal status: INITIAL  PLAN:  PT FREQUENCY: 2x/week  PT DURATION: 10 weeks  PLANNED INTERVENTIONS: 97110-Therapeutic exercises, 97530- Therapeutic activity, 97112- Neuromuscular re-education, 97535- Self Care, 11914- Manual therapy, [phone]- Gait training, [phone]- Ionotophoresis 4mg /ml Dexamethasone, Patient/Family education, Balance training, Stair training, Dry Needling, Joint mobilization, Spinal mobilization, Cryotherapy, and Moist heat.  PLAN FOR NEXT SESSION: Assess lumbar ROM, additional SI joint testing, update HEP.   Iona Beard, DPT 03/09/2023, 2:07 PM

## 2023-03-09 ENCOUNTER — Ambulatory Visit: Payer: Medicaid Other | Attending: Sports Medicine | Admitting: Physical Therapy

## 2023-03-09 ENCOUNTER — Encounter: Payer: Self-pay | Admitting: Physical Therapy

## 2023-03-09 DIAGNOSIS — M533 Sacrococcygeal disorders, not elsewhere classified: Secondary | ICD-10-CM | POA: Diagnosis present

## 2023-03-09 DIAGNOSIS — R293 Abnormal posture: Secondary | ICD-10-CM

## 2023-03-09 DIAGNOSIS — R278 Other lack of coordination: Secondary | ICD-10-CM | POA: Diagnosis present

## 2023-03-09 DIAGNOSIS — M6281 Muscle weakness (generalized): Secondary | ICD-10-CM

## 2023-03-16 ENCOUNTER — Ambulatory Visit: Payer: Medicaid Other

## 2023-03-16 ENCOUNTER — Other Ambulatory Visit: Payer: Self-pay

## 2023-03-16 DIAGNOSIS — M6281 Muscle weakness (generalized): Secondary | ICD-10-CM

## 2023-03-16 DIAGNOSIS — R278 Other lack of coordination: Secondary | ICD-10-CM

## 2023-03-16 DIAGNOSIS — R293 Abnormal posture: Secondary | ICD-10-CM | POA: Diagnosis not present

## 2023-03-16 DIAGNOSIS — M533 Sacrococcygeal disorders, not elsewhere classified: Secondary | ICD-10-CM

## 2023-03-16 NOTE — Therapy (Signed)
OUTPATIENT PHYSICAL THERAPY THORACOLUMBAR TREATMENT   Patient Name: Krystal Lloyd MRN: [id number] DOB:November 22, 1990, 32 y.o., female Today's Date: 03/16/2023  END OF SESSION:  PT End of Session - 03/16/23 1711     Visit Number 2    Date for PT Re-Evaluation 05/18/23    Progress Note Due on Visit 10    PT Start Time 1600    PT Stop Time 1645    PT Time Calculation (min) 45 min    Activity Tolerance Patient tolerated treatment well    Behavior During Therapy [facility] for tasks assessed/performed             Past Medical History:  Diagnosis Date   Hepatitis B    Medical history non-contributory    Tattoo 09/28/2019   Past Surgical History:  Procedure Laterality Date   DILATION AND CURETTAGE OF UTERUS N/A 11/29/2021   Procedure: DILATATION AND CURETTAGE;  Surgeon: Edwinna Areola, DO;  Location: [facility] LD ORS;  Service: Gynecology;  Laterality: N/A;   NO PAST SURGERIES     Patient Active Problem List   Diagnosis Date Noted   Retained products of conception after delivery without hemorrhage 11/29/2021   Term pregnancy 11/28/2021   Tattoo 09/28/2019   Chronic viral hepatitis B without delta-agent (HCC) 04/13/2019    PCP: None  REFERRING PROVIDER: Delfin Gant, MD   REFERRING DIAG: M54.50 (ICD-10-CM) - Low back pain, unspecified   Rationale for Evaluation and Treatment: Rehabilitation  THERAPY DIAG:  Abnormal posture  Muscle weakness (generalized)  Other lack of coordination  Sacroiliac joint pain  ONSET DATE: 02/23/23  SUBJECTIVE:                                                                                                                                                                                           SUBJECTIVE STATEMENT: Patient reports that she has been having 3 month H/O R hip pain which shoots down into her leg. Worse at night.  PERTINENT HISTORY:  Per referring physician note: 2 month H/O LBP with R leg sciatica, worse when lying down,    PAIN:  Are you having pain? Yes: NPRS scale: 10/10 Pain location: R hip and into R leg Pain description: shooting, nerve pain Aggravating factors: Wakes her up at night, lying down Relieving factors: None, Ibuprofen helps a little.  PRECAUTIONS: Back  RED FLAGS: None   WEIGHT BEARING RESTRICTIONS: No  FALLS:  Has patient fallen in last 6 months? 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  PLOF: Independent  PATIENT GOALS: Patient would like to get relief from her pain and sleep without pain.  NEXT MD VISIT: none scheduled  OBJECTIVE:  Note: Objective measures were completed at Evaluation unless otherwise noted.  DIAGNOSTIC FINDINGS:  Had Xray, but not able to see  COGNITION: Overall cognitive status: Within functional limits for tasks assessed     SENSATION: Patient reports T & N in R leg.  MUSCLE LENGTH: Hamstrings: Right 80 deg; Left 80 deg Thomas test: Right neutral deg; Left neutral deg  POSTURE: increased lumbar lordosis, decreased thoracic kyphosis, and left pelvic obliquity  PALPATION: No TTP in either hips or gluts. TTP over R SI joint  LUMBAR ROM: TBD  AROM eval  Flexion   Extension   Right lateral flexion   Left lateral flexion   Right rotation   Left rotation    (Blank rows = not tested)  LOWER EXTREMITY ROM: WNL in all planes, R hip IR/ER greater than L.  LOWER EXTREMITY MMT:  Patient with overall weakness, consistently (4-)-4/5 throughout hips, knees, ankles.  LUMBAR SPECIAL TESTS:  Straight leg raise test: Negative, Slump test: Positive, and FABER test: Positive  SI ASSESSMENT: L pelvic obliquity, R iliac crest, ASIS, PSIS all lower than L, (+) for pain with R iliac post and ant rotation, unchanged with repetition. R leg length < 1/4" shorter than L  FUNCTIONAL TESTS:     GAIT: Distance walked: In clinic distances Assistive device utilized: None Level of assistance: Complete Independence Comments: No obvious deviations noted.  TODAY'S TREATMENT:                                                                                                                              DATE:  03/16/23:  Assessed lumbar ROM: flexion wnl Extension 50% with pain provocation SB R 50% with pain provocation SB L wnl Seated lumbar flexion with no deviation noted lower lumbar spine  Assessed posture:  R lat pelvic shift noted, concavity R lumbar SI testing:  standing march test: decreased movement L Thigh thrust - B Faber's - B Prone R knee bend with pain provocation R SI region and increased radicular Sx R  Prone props with pain R SI  Supine with greater than 1 cm difference in ASIS height, R lower  Prone SITS bones mildly elevated R   Manual: utilized a variety of manual techniques to attempt to improve patient's ability to isolate lumbar extension without increased Sx: Supine manually resisted hip ext with alt knee to chest to engage gluts and rotate pelvis, 5 sec holds, 3 reps each side  Supine with R hip extended, L hip flexed, for 5 sec resisted hold by PT to correct for possible R ant SI rotation  Side lying on L for lumbar ext, rot, SB muscle energy technique, increased pain note by pt so discontinued  Prone lying for manual distraction R hip, with thrust 3 reps, pt  with some relief R LE radicular sx with this technique  Side lying on R side, for muscle energy technique for R lower lumbar flex, rot, SB restriction, resistance provided by PT for downward rotation B hips, improved pain definitely noted by pt with this technique  Instructed pt in self muscle energy in sitting, crossing legs and rotating trunk toward top leg with isometric trunk rotation opposite direction.  Pt advised to try prior to sleep, as she had some relief with this  motion.     Rx: 03/09/23 Education  PATIENT EDUCATION:  Education details: POC, HEP Person educated: Patient Education method: Explanation Education comprehension: verbalized understanding  HOME EXERCISE PROGRAM: [id number]  ASSESSMENT:  CLINICAL IMPRESSION: Patient is a 32 y.o. who participated  today with physical therapy treatment for 3 month H/O R hip pain with radiation into RLE. Reassessed her posture, LS ROM.  MMT for R quads equal B.  She does have marked R lat pelvic shift and her radicular numbness is R ant thigh. She did not really have any provocative signs today with SI testing, although very notable difference , probable rotation of her pelvis.  Also provocation of thigh sx with R prone knee bend and with prone trunk ext. The most effective technique today was muscle energy to improve R lumbar flex/rot/SB mobility.  Did not attempt pelvic traction manually today as she reports has already tried with chiropractor previously without relief.  Still with some R lat pelvic shift at end of session.   OBJECTIVE IMPAIRMENTS: Abnormal gait, decreased activity tolerance, decreased mobility, difficulty walking, decreased ROM, decreased strength, increased fascial restrictions, increased muscle spasms, impaired flexibility, improper body mechanics, postural dysfunction, and pain.   ACTIVITY LIMITATIONS: carrying, lifting, bending, squatting, sleeping, stairs, and locomotion level  PARTICIPATION LIMITATIONS: meal prep, cleaning, laundry, shopping, community activity, and occupation  PERSONAL FACTORS: Past/current experiences are also affecting patient's functional outcome.   REHAB POTENTIAL: Good  CLINICAL DECISION MAKING: Stable/uncomplicated  EVALUATION COMPLEXITY: Low   GOALS: Goals reviewed with patient? Yes  SHORT TERM GOALS: Target date: 03/30/23  I with initial HEP Baseline: Goal status: INITIAL  LONG TERM GOALS: Target date: 05/18/23  I with final HEP Baseline:   Goal status: INITIAL  2.  Patient will perform sciatic neural tensioning with no pain Baseline:  Goal status: INITIAL  3.  Patient will report the ability to sleep through the night without being awakened by R hip and/or leg pain Baseline:  Goal status: INITIAL  4.  Increase B hip strength to 4+/5 Baseline:  Goal status: INITIAL  5.  Patient will perform SLS on either leg x 30 sec including gentle forward/back swing of opposite leg. Baseline:  Goal status: INITIAL  PLAN:  PT FREQUENCY: 2x/week  PT DURATION: 10 weeks  PLANNED INTERVENTIONS: 97110-Therapeutic exercises, 97530- Therapeutic activity, 97112- Neuromuscular re-education, 97535- Self Care, [id number]- Manual therapy, [phone]- Gait training, [phone]- Ionotophoresis 4mg /ml Dexamethasone, Patient/Family education, Balance training, Stair training, Dry Needling, Joint mobilization, Spinal mobilization, Cryotherapy, and Moist heat.  PLAN FOR NEXT SESSION: continue to assess SI, lumbar mobility   Suhaib Guzzo, PT, DPT, OCS 03/16/2023, 5:16 PM

## 2023-03-23 ENCOUNTER — Ambulatory Visit: Payer: Medicaid Other

## 2023-03-26 ENCOUNTER — Ambulatory Visit: Payer: Medicaid Other | Admitting: Physical Therapy

## 2023-03-26 DIAGNOSIS — M533 Sacrococcygeal disorders, not elsewhere classified: Secondary | ICD-10-CM

## 2023-03-26 DIAGNOSIS — R293 Abnormal posture: Secondary | ICD-10-CM | POA: Diagnosis not present

## 2023-03-26 DIAGNOSIS — M6281 Muscle weakness (generalized): Secondary | ICD-10-CM

## 2023-03-26 DIAGNOSIS — R278 Other lack of coordination: Secondary | ICD-10-CM

## 2023-03-26 NOTE — Therapy (Signed)
OUTPATIENT PHYSICAL THERAPY THORACOLUMBAR TREATMENT   Patient Name: Krystal Lloyd MRN: [id number] DOB:[birth year], 32 y.o., female Today's Date: 03/26/2023  END OF SESSION:  PT End of Session - 03/26/23 0933     Visit Number 3    Date for PT Re-Evaluation 05/18/23    Progress Note Due on Visit 10    PT Start Time 0933    PT Stop Time 1015    PT Time Calculation (min) 42 min    Activity Tolerance Patient tolerated treatment well    Behavior During Therapy [facility] for tasks assessed/performed              Past Medical History:  Diagnosis Date   Hepatitis B    Medical history non-contributory    Tattoo 09/28/2019   Past Surgical History:  Procedure Laterality Date   DILATION AND CURETTAGE OF UTERUS N/A 11/29/2021   Procedure: DILATATION AND CURETTAGE;  Surgeon: Edwinna Areola, DO;  Location: [facility] LD ORS;  Service: Gynecology;  Laterality: N/A;   NO PAST SURGERIES     Patient Active Problem List   Diagnosis Date Noted   Retained products of conception after delivery without hemorrhage 11/29/2021   Term pregnancy 11/28/2021   Tattoo 09/28/2019   Chronic viral hepatitis B without delta-agent (HCC) 04/13/2019    PCP: None  REFERRING PROVIDER: Delfin Gant, MD   REFERRING DIAG: M54.50 (ICD-10-CM) - Low back pain, unspecified   Rationale for Evaluation and Treatment: Rehabilitation  THERAPY DIAG:  Abnormal posture  Muscle weakness (generalized)  Other lack of coordination  Sacroiliac joint pain  ONSET DATE: 02/23/23  SUBJECTIVE:                                                                                                                                                                                           SUBJECTIVE STATEMENT: Pt states some days are better than others. Pt states it is feeling okay today. Feels it the most when she lays down too long -- especially at night. Pt reports she sleeps on her side.   PERTINENT HISTORY:  Per referring  physician note: 2 month H/O LBP with R leg sciatica, worse when lying down,   PAIN:  Are you having pain? Yes: NPRS scale: 5/10 Pain location: R hip and into R leg Pain description: shooting, nerve pain Aggravating factors: Wakes her up at night, lying down Relieving factors: None, Ibuprofen helps a little.  PRECAUTIONS: Back  RED FLAGS: None   WEIGHT BEARING RESTRICTIONS: No  FALLS:  Has patient fallen in last 6 months? 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  PLOF: Independent  PATIENT GOALS: Patient would like to get relief from her pain and sleep without pain.  NEXT MD VISIT: none scheduled  OBJECTIVE:  Note: Objective measures were completed at Evaluation unless otherwise noted.  DIAGNOSTIC FINDINGS:  Had Xray, but not able to see  COGNITION: Overall cognitive status: Within functional limits for tasks assessed     SENSATION: Patient reports T & N in R leg.  MUSCLE LENGTH: Hamstrings: Right 80 deg; Left 80 deg Thomas test: Right neutral deg; Left neutral deg  POSTURE: increased lumbar lordosis, decreased thoracic kyphosis, and left pelvic obliquity  PALPATION: No TTP in either hips or gluts. TTP over R SI joint  LUMBAR ROM: TBD  AROM eval  Flexion   Extension   Right lateral flexion   Left lateral flexion   Right rotation   Left rotation    (Blank rows = not tested)  LOWER EXTREMITY ROM: WNL in all planes, R hip IR/ER greater than L.  LOWER EXTREMITY MMT:  Patient with overall weakness, consistently (4-)-4/5 throughout hips, knees, ankles.  LUMBAR SPECIAL TESTS:  Straight leg raise test: Negative, Slump test: Positive, and FABER test: Positive  SI ASSESSMENT: L pelvic obliquity, R iliac crest, ASIS, PSIS all lower than L, (+) for pain with R iliac post and ant rotation, unchanged with repetition.  R leg length < 1/4" shorter than L  FUNCTIONAL TESTS:    GAIT: Distance walked: In clinic distances Assistive device utilized: None Level of assistance: Complete Independence Comments: No obvious deviations noted.  TODAY'S TREATMENT:                                                                                                                              DATE:  03/26/23 Treadmill 1.8 mph x 5 min for warm up Supine trunk rotation 5x5" Supine piriformis stretch 2x30" Supine figure 4 stretch 2x30" Prone hip ext 2x10 (pt noted increased pain with this) Child's pose 2x30" Quadruped bird dog 2x10 (pt noted increased pain with this) Quadruped fire hydrant 2x10 Supine bilat bent leg lift 2x10 Standing PPT + wall squat 2x10 Manual therapy:  STM& TPR R sacral paraspinals/glute max  Skilled assessment and palpation for TPDN  Trigger Point Dry-Needling  Treatment instructions: Expect mild to moderate muscle soreness. S/S of pneumothorax if dry needled over a lung field, and to seek immediate medical attention should they occur. Patient verbalized understanding of these instructions and education.  Patient Consent Given: Yes Education handout provided: Yes Muscles treated: R sacral paraspinals Electrical stimulation performed: No Parameters: N/A Treatment response/outcome: Decreased muscle tension   03/16/23:   Assessed lumbar ROM: flexion wnl Extension 50% with pain provocation SB R 50% with pain provocation SB L wnl Seated lumbar flexion with no deviation noted lower lumbar spine  Assessed posture:  R lat pelvic shift noted, concavity  R lumbar SI testing:  standing march test: decreased movement L Thigh thrust - B Faber's - B Prone R knee bend with pain provocation R SI region and increased radicular Sx R  Prone props with pain R SI  Supine with greater than 1 cm difference in ASIS height, R lower  Prone SITS bones mildly elevated R   Manual: utilized a variety of manual  techniques to attempt to improve patient's ability to isolate lumbar extension without increased Sx: Supine manually resisted hip ext with alt knee to chest to engage gluts and rotate pelvis, 5 sec holds, 3 reps each side  Supine with R hip extended, L hip flexed, for 5 sec resisted hold by PT to correct for possible R ant SI rotation  Side lying on L for lumbar ext, rot, SB muscle energy technique, increased pain note by pt so discontinued  Prone lying for manual distraction R hip, with thrust 3 reps, pt with some relief R LE radicular sx with this technique  Side lying on R side, for muscle energy technique for R lower lumbar flex, rot, SB restriction, resistance provided by PT for downward rotation B hips, improved pain definitely noted by pt with this technique  Instructed pt in self muscle energy in sitting, crossing legs and rotating trunk toward top leg with isometric trunk rotation opposite direction.  Pt advised to try prior to sleep, as she had some relief with this motion.   03/09/23 Education  PATIENT EDUCATION:  Education details: POC, HEP Person educated: Patient Education method: Explanation Education comprehension: verbalized understanding  HOME EXERCISE PROGRAM: [id number]  ASSESSMENT:  CLINICAL IMPRESSION: Patient is a 32 y.o. who participated  today with physical therapy treatment for 3 month H/O R hip pain with radiation into RLE. Treatment session focused on gentle stretching and trial of TPDN to reduce muscle spasm and any impingement on sciatic nerve that may be causing her radiating symptoms. Pt pain was exacerbated with increased hip extension exercises -- focused on more flexion based approaches today for core strengthening and then will try to progress to more extension as her core gets stronger.     GOALS: Goals reviewed with patient? Yes  SHORT TERM GOALS: Target date: 03/30/23  I with initial HEP Baseline: Goal status: INITIAL  LONG TERM GOALS:  Target date: 05/18/23  I with final HEP Baseline:  Goal status: INITIAL  2.  Patient will perform sciatic neural tensioning with no pain Baseline:  Goal status: INITIAL  3.  Patient will report the ability to sleep through the night without being awakened by R hip and/or leg pain Baseline:  Goal status: INITIAL  4.  Increase B hip strength to 4+/5 Baseline:  Goal status: INITIAL  5.  Patient will perform SLS on either leg x 30 sec including gentle forward/back swing of opposite leg. Baseline:  Goal status: INITIAL  PLAN:  PT FREQUENCY: 2x/week  PT DURATION: 10 weeks  PLANNED INTERVENTIONS: 97110-Therapeutic exercises, 97530- Therapeutic activity, 97112- Neuromuscular re-education, 97535- Self Care, 16109- Manual therapy, [phone]- Gait training, [phone]- Ionotophoresis 4mg /ml Dexamethasone, Patient/Family education, Balance training, Stair training, Dry Needling, Joint mobilization, Spinal mobilization, Cryotherapy, and Moist heat.  PLAN FOR NEXT SESSION: continue to assess SI, lumbar mobility   Starlet Gallentine April Ma L Almont, PT, DPT 03/26/2023, 9:33 AM

## 2023-03-30 ENCOUNTER — Ambulatory Visit: Payer: Medicaid Other

## 2023-04-02 ENCOUNTER — Ambulatory Visit: Payer: Medicaid Other | Attending: Sports Medicine

## 2023-04-02 DIAGNOSIS — M6281 Muscle weakness (generalized): Secondary | ICD-10-CM | POA: Insufficient documentation

## 2023-04-02 DIAGNOSIS — M533 Sacrococcygeal disorders, not elsewhere classified: Secondary | ICD-10-CM | POA: Diagnosis present

## 2023-04-02 DIAGNOSIS — R293 Abnormal posture: Secondary | ICD-10-CM | POA: Insufficient documentation

## 2023-04-02 DIAGNOSIS — R278 Other lack of coordination: Secondary | ICD-10-CM | POA: Insufficient documentation

## 2023-04-02 NOTE — Patient Instructions (Signed)

## 2023-04-02 NOTE — Therapy (Addendum)
 OUTPATIENT PHYSICAL THERAPY THORACOLUMBAR TREATMENT   Patient Name: Krystal Lloyd MRN: [id number] DOB:11-Sep-1990, 33 y.o., female Today's Date: 04/02/2023  END OF SESSION:  PT End of Session - 04/02/23 1230     Visit Number 4    Date for PT Re-Evaluation 05/18/23    Progress Note Due on Visit 10    PT Start Time 1230    PT Stop Time 1315    PT Time Calculation (min) 45 min    Activity Tolerance Patient tolerated treatment well    Behavior During Therapy [facility] for tasks assessed/performed               Past Medical History:  Diagnosis Date   Hepatitis B    Medical history non-contributory    Tattoo 09/28/2019   Past Surgical History:  Procedure Laterality Date   DILATION AND CURETTAGE OF UTERUS N/A 11/29/2021   Procedure: DILATATION AND CURETTAGE;  Surgeon: Delana Ted Morrison, DO;  Location: [facility] LD ORS;  Service: Gynecology;  Laterality: N/A;   NO PAST SURGERIES     Patient Active Problem List   Diagnosis Date Noted   Retained products of conception after delivery without hemorrhage 11/29/2021   Term pregnancy 11/28/2021   Tattoo 09/28/2019   Chronic viral hepatitis B without delta-agent (HCC) 04/13/2019    PCP: None  REFERRING PROVIDER: Arnaldo Juliene RAMAN, MD   REFERRING DIAG: M54.50 (ICD-10-CM) - Low back pain, unspecified   Rationale for Evaluation and Treatment: Rehabilitation  THERAPY DIAG:  Abnormal posture  Muscle weakness (generalized)  Other lack of coordination  Sacroiliac joint pain  ONSET DATE: 02/23/23  SUBJECTIVE:                                                                                                                                                                                           SUBJECTIVE STATEMENT: Having some pain in the back, I think the dry needling helped last time.   PERTINENT HISTORY:  Per referring physician note: 2 month H/O LBP with R leg sciatica, worse when lying down,   PAIN:  Are you having pain? Yes:  NPRS scale: 4-5/10 Pain location: R hip and into R leg Pain description: shooting, nerve pain Aggravating factors: Wakes her up at night, lying down Relieving factors: None, Ibuprofen  helps a little.  PRECAUTIONS: Back  RED FLAGS: None   WEIGHT BEARING RESTRICTIONS: No  FALLS:  Has patient fallen in last 6 months? 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  PLOF:  Independent  PATIENT GOALS: Patient would like to get relief from her pain and sleep without pain.  NEXT MD VISIT: none scheduled  OBJECTIVE:  Note: Objective measures were completed at Evaluation unless otherwise noted.  DIAGNOSTIC FINDINGS:  Had Xray, but not able to see  COGNITION: Overall cognitive status: Within functional limits for tasks assessed     SENSATION: Patient reports T & N in R leg.  MUSCLE LENGTH: Hamstrings: Right 80 deg; Left 80 deg Thomas test: Right neutral deg; Left neutral deg  POSTURE: increased lumbar lordosis, decreased thoracic kyphosis, and left pelvic obliquity  PALPATION: No TTP in either hips or gluts. TTP over R SI joint  LUMBAR ROM: TBD  AROM eval  Flexion   Extension   Right lateral flexion   Left lateral flexion   Right rotation   Left rotation    (Blank rows = not tested)  LOWER EXTREMITY ROM: WNL in all planes, R hip IR/ER greater than L.  LOWER EXTREMITY MMT:  Patient with overall weakness, consistently (4-)-4/5 throughout hips, knees, ankles.  LUMBAR SPECIAL TESTS:  Straight leg raise test: Negative, Slump test: Positive, and FABER test: Positive  SI ASSESSMENT: L pelvic obliquity, R iliac crest, ASIS, PSIS all lower than L, (+) for pain with R iliac post and ant rotation, unchanged with repetition. R leg length < 1/4 shorter than L  FUNCTIONAL TESTS:    GAIT: Distance walked: In clinic  distances Assistive device utilized: None Level of assistance: Complete Independence Comments: No obvious deviations noted.  TODAY'S TREATMENT:                                                                                                                              DATE:  04/02/23 Bike L2 x11mins  TPDN  Trigger Point Dry-Needling  Treatment instructions: Expect mild to moderate muscle soreness. S/S of pneumothorax if dry needled over a lung field, and to seek immediate medical attention should they occur. Patient verbalized understanding of these instructions and education.  Patient Consent Given: Yes Education handout provided: Yes Muscles treated: R sacral paraspinals Electrical stimulation performed: No Parameters: N/A Treatment response/outcome: Decreased muscle tension Provided by April Nonato, PT, DPT  Supine stretches  -HS, SKTC, DKTC, piriformis, glutes 30s  PPT 2x10 Deadbugs 2x10 alternating  Abdominal roll ups with ball 2x10 Feet on pball rotations, knees to chest, small bridges x10 Sidelying clamshells red 2x10 STS 2x10   03/26/23 Treadmill 1.8 mph x 5 min for warm up Supine trunk rotation 5x5 Supine piriformis stretch 2x30 Supine figure 4 stretch 2x30 Prone hip ext 2x10 (pt noted increased pain with this) Child's pose 2x30 Quadruped bird dog 2x10 (pt noted increased pain with this) Quadruped fire hydrant 2x10 Supine bilat bent leg lift 2x10 Standing PPT + wall squat 2x10 Manual therapy:  STM& TPR R sacral paraspinals/glute max  Skilled assessment and palpation for TPDN  Trigger Point Dry-Needling  Treatment instructions: Expect mild to moderate muscle  soreness. S/S of pneumothorax if dry needled over a lung field, and to seek immediate medical attention should they occur. Patient verbalized understanding of these instructions and education.  Patient Consent Given: Yes Education handout provided: Yes Muscles treated: R sacral paraspinals Electrical  stimulation performed: No Parameters: N/A Treatment response/outcome: Decreased muscle tension   03/16/23:   Assessed lumbar ROM: flexion wnl Extension 50% with pain provocation SB R 50% with pain provocation SB L wnl Seated lumbar flexion with no deviation noted lower lumbar spine  Assessed posture:  R lat pelvic shift noted, concavity R lumbar SI testing:  standing march test: decreased movement L Thigh thrust - B Faber's - B Prone R knee bend with pain provocation R SI region and increased radicular Sx R  Prone props with pain R SI  Supine with greater than 1 cm difference in ASIS height, R lower  Prone SITS bones mildly elevated R   Manual: utilized a variety of manual techniques to attempt to improve patient's ability to isolate lumbar extension without increased Sx: Supine manually resisted hip ext with alt knee to chest to engage gluts and rotate pelvis, 5 sec holds, 3 reps each side  Supine with R hip extended, L hip flexed, for 5 sec resisted hold by PT to correct for possible R ant SI rotation  Side lying on L for lumbar ext, rot, SB muscle energy technique, increased pain note by pt so discontinued  Prone lying for manual distraction R hip, with thrust 3 reps, pt with some relief R LE radicular sx with this technique  Side lying on R side, for muscle energy technique for R lower lumbar flex, rot, SB restriction, resistance provided by PT for downward rotation B hips, improved pain definitely noted by pt with this technique  Instructed pt in self muscle energy in sitting, crossing legs and rotating trunk toward top leg with isometric trunk rotation opposite direction.  Pt advised to try prior to sleep, as she had some relief with this motion.   03/09/23 Education  PATIENT EDUCATION:  Education details: POC, HEP Person educated: Patient Education method: Explanation Education comprehension: verbalized understanding  HOME EXERCISE  PROGRAM: [id number]  ASSESSMENT:  CLINICAL IMPRESSION: Patient is a 33 y.o. who participated  today with physical therapy treatment for 3 month H/O R hip pain with radiation into RLE. She reports her back pain started after giving birth 1 year ago. Treatment session focused on gentle stretching and core strengthening. We did TPDN again as she reported that it seemed to help last time. Done by licensed therapist, April Nonato. Reports some pain in R leg and foot with deadbugs. Will try to progress to strengthening posterior chain and core to help decrease back pain.   GOALS: Goals reviewed with patient? Yes  SHORT TERM GOALS: Target date: 03/30/23  I with initial HEP Baseline: Goal status: INITIAL  LONG TERM GOALS: Target date: 05/18/23  I with final HEP Baseline:  Goal status: INITIAL  2.  Patient will perform sciatic neural tensioning with no pain Baseline:  Goal status: INITIAL  3.  Patient will report the ability to sleep through the night without being awakened by R hip and/or leg pain Baseline:  Goal status: INITIAL  4.  Increase B hip strength to 4+/5 Baseline:  Goal status: INITIAL  5.  Patient will perform SLS on either leg x 30 sec including gentle forward/back swing of opposite leg. Baseline:  Goal status: INITIAL  PLAN:  PT FREQUENCY: 2x/week  PT  DURATION: 10 weeks  PLANNED INTERVENTIONS: 97110-Therapeutic exercises, 97530- Therapeutic activity, [id number]- Neuromuscular re-education, 97535- Self Care, [id number]- Manual therapy, [phone]- Gait training, [phone]- Ionotophoresis 4mg /ml Dexamethasone, Patient/Family education, Balance training, Stair training, Dry Needling, Joint mobilization, Spinal mobilization, Cryotherapy, and Moist heat.  PLAN FOR NEXT SESSION: continue to assess SI, lumbar mobility   Almetta Fam, PT, DPT 04/02/2023, 1:10 PM

## 2023-04-06 ENCOUNTER — Encounter: Payer: Self-pay | Admitting: Physical Therapy

## 2023-04-06 ENCOUNTER — Ambulatory Visit: Payer: Medicaid Other | Admitting: Physical Therapy

## 2023-04-06 DIAGNOSIS — R278 Other lack of coordination: Secondary | ICD-10-CM

## 2023-04-06 DIAGNOSIS — R293 Abnormal posture: Secondary | ICD-10-CM

## 2023-04-06 DIAGNOSIS — M6281 Muscle weakness (generalized): Secondary | ICD-10-CM

## 2023-04-06 DIAGNOSIS — M533 Sacrococcygeal disorders, not elsewhere classified: Secondary | ICD-10-CM

## 2023-04-06 NOTE — Therapy (Signed)
 OUTPATIENT PHYSICAL THERAPY THORACOLUMBAR TREATMENT   Patient Name: Dionicia Cerritos MRN: [id number] DOB:08-29-1990, 33 y.o., female Today's Date: 04/06/2023  END OF SESSION:  PT End of Session - 04/06/23 1240     Visit Number 5    Date for PT Re-Evaluation 05/18/23    PT Start Time 1232    PT Stop Time 1312    PT Time Calculation (min) 40 min    Activity Tolerance Patient tolerated treatment well    Behavior During Therapy [facility] for tasks assessed/performed            Past Medical History:  Diagnosis Date   Hepatitis B    Medical history non-contributory    Tattoo 09/28/2019   Past Surgical History:  Procedure Laterality Date   DILATION AND CURETTAGE OF UTERUS N/A 11/29/2021   Procedure: DILATATION AND CURETTAGE;  Surgeon: Delana Ted Morrison, DO;  Location: [facility] LD ORS;  Service: Gynecology;  Laterality: N/A;   NO PAST SURGERIES     Patient Active Problem List   Diagnosis Date Noted   Retained products of conception after delivery without hemorrhage 11/29/2021   Term pregnancy 11/28/2021   Tattoo 09/28/2019   Chronic viral hepatitis B without delta-agent (HCC) 04/13/2019    PCP: None  REFERRING PROVIDER: Arnaldo Juliene RAMAN, MD   REFERRING DIAG: M54.50 (ICD-10-CM) - Low back pain, unspecified   Rationale for Evaluation and Treatment: Rehabilitation  THERAPY DIAG:  Abnormal posture  Muscle weakness (generalized)  Other lack of coordination  Sacroiliac joint pain  ONSET DATE: 02/23/23  SUBJECTIVE:                                                                                                                                                                                           SUBJECTIVE STATEMENT: Patient reports that the pain is getting better. She feels the dry needling helped last time.   PERTINENT HISTORY:  Per referring physician note: 2 month H/O LBP with R leg sciatica, worse when lying down,   PAIN:  Are you having pain? Yes: NPRS scale:  4-5/10 Pain location: R hip and into R leg Pain description: shooting, nerve pain Aggravating factors: Wakes her up at night, lying down Relieving factors: None, Ibuprofen  helps a little.  PRECAUTIONS: Back  RED FLAGS: None   WEIGHT BEARING RESTRICTIONS: No  FALLS:  Has patient fallen in last 6 months? 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  PLOF: Independent  PATIENT GOALS: Patient would like to get relief  from her pain and sleep without pain.  NEXT MD VISIT: none scheduled  OBJECTIVE:  Note: Objective measures were completed at Evaluation unless otherwise noted.  DIAGNOSTIC FINDINGS:  Had Xray, but not able to see  COGNITION: Overall cognitive status: Within functional limits for tasks assessed     SENSATION: Patient reports T & N in R leg.  MUSCLE LENGTH: Hamstrings: Right 80 deg; Left 80 deg Thomas test: Right neutral deg; Left neutral deg  POSTURE: increased lumbar lordosis, decreased thoracic kyphosis, and left pelvic obliquity  PALPATION: No TTP in either hips or gluts. TTP over R SI joint  LUMBAR ROM: TBD  AROM eval  Flexion   Extension   Right lateral flexion   Left lateral flexion   Right rotation   Left rotation    (Blank rows = not tested)  LOWER EXTREMITY ROM: WNL in all planes, R hip IR/ER greater than L.  LOWER EXTREMITY MMT:  Patient with overall weakness, consistently (4-)-4/5 throughout hips, knees, ankles.  LUMBAR SPECIAL TESTS:  Straight leg raise test: Negative, Slump test: Positive, and FABER test: Positive  SI ASSESSMENT: L pelvic obliquity, R iliac crest, ASIS, PSIS all lower than L, (+) for pain with R iliac post and ant rotation, unchanged with repetition. R leg length < 1/4 shorter than L  FUNCTIONAL TESTS:    GAIT: Distance walked: In clinic distances Assistive device  utilized: None Level of assistance: Complete Independence Comments: No obvious deviations noted.  TODAY'S TREATMENT:                                                                                                                              DATE:  04/06/23 Trigger Point Dry Needling  Subsequent Treatment: Instructions provided previously at initial dry needling treatment.   Patient Verbal Consent Given: Yes Education Handout Provided: Previously Provided Muscles Treated: right lumbar pspinals Treatment Response/Outcome: LTR's  Performed by:  CHRISTELLA Mainland, PT   NuStep L3 x 6 minutes Mini squats, 2 x 10 reps, emphasizing upright posture and control B side to side step against R tband resistance at knees. Standing shoulder ext, rows, ER against R tband resistance, 2 x 10 each Supine LTR, 5 sec holds, x 5 each way LTR to L with R hip flexed to 90 degrees. More stretch felt in the painful area, as well as ITB  04/02/23 Bike L2 x70mins  TPDN  Trigger Point Dry-Needling  Treatment instructions: Expect mild to moderate muscle soreness. S/S of pneumothorax if dry needled over a lung field, and to seek immediate medical attention should they occur. Patient verbalized understanding of these instructions and education.  Patient Consent Given: Yes Education handout provided: Yes Muscles treated: R sacral paraspinals Electrical stimulation performed: No Parameters: N/A Treatment response/outcome: Decreased muscle tension Provided by April Nonato, PT, DPT  Supine stretches  -HS, SKTC, DKTC, piriformis, glutes 30s  PPT 2x10 Deadbugs 2x10 alternating  Abdominal roll ups with ball 2x10 Feet on  pball rotations, knees to chest, small bridges x10 Sidelying clamshells red 2x10 STS 2x10   03/26/23 Treadmill 1.8 mph x 5 min for warm up Supine trunk rotation 5x5 Supine piriformis stretch 2x30 Supine figure 4 stretch 2x30 Prone hip ext 2x10 (pt noted increased pain with this) Child's pose  2x30 Quadruped bird dog 2x10 (pt noted increased pain with this) Quadruped fire hydrant 2x10 Supine bilat bent leg lift 2x10 Standing PPT + wall squat 2x10 Manual therapy:  STM& TPR R sacral paraspinals/glute max  Skilled assessment and palpation for TPDN  Trigger Point Dry-Needling  Treatment instructions: Expect mild to moderate muscle soreness. S/S of pneumothorax if dry needled over a lung field, and to seek immediate medical attention should they occur. Patient verbalized understanding of these instructions and education.  Patient Consent Given: Yes Education handout provided: Yes Muscles treated: R sacral paraspinals Electrical stimulation performed: No Parameters: N/A Treatment response/outcome: Decreased muscle tension   03/16/23:   Assessed lumbar ROM: flexion wnl Extension 50% with pain provocation SB R 50% with pain provocation SB L wnl Seated lumbar flexion with no deviation noted lower lumbar spine  Assessed posture:  R lat pelvic shift noted, concavity R lumbar SI testing:  standing march test: decreased movement L Thigh thrust - B Faber's - B Prone R knee bend with pain provocation R SI region and increased radicular Sx R  Prone props with pain R SI  Supine with greater than 1 cm difference in ASIS height, R lower  Prone SITS bones mildly elevated R   Manual: utilized a variety of manual techniques to attempt to improve patient's ability to isolate lumbar extension without increased Sx: Supine manually resisted hip ext with alt knee to chest to engage gluts and rotate pelvis, 5 sec holds, 3 reps each side  Supine with R hip extended, L hip flexed, for 5 sec resisted hold by PT to correct for possible R ant SI rotation  Side lying on L for lumbar ext, rot, SB muscle energy technique, increased pain note by pt so discontinued  Prone lying for manual distraction R hip, with thrust 3 reps, pt with some relief R LE radicular sx with this technique  Side lying on  R side, for muscle energy technique for R lower lumbar flex, rot, SB restriction, resistance provided by PT for downward rotation B hips, improved pain definitely noted by pt with this technique  Instructed pt in self muscle energy in sitting, crossing legs and rotating trunk toward top leg with isometric trunk rotation opposite direction.  Pt advised to try prior to sleep, as she had some relief with this motion.  03/09/23 Education  PATIENT EDUCATION:  Education details: POC, HEP Person educated: Patient Education method: Explanation Education comprehension: verbalized understanding  HOME EXERCISE PROGRAM: [id number]  ASSESSMENT:  CLINICAL IMPRESSION: Patient is a 33 y.o. who participated  today with physical therapy treatment for 3 + month H/O R hip pain with radiation into RLE. She reports her back pain started after giving birth 1 year ago.  She reports that her pain is still present, but continues to diminish. She feels the DN is very effective. Continued with the DN today. Added some additional upper and lower body stabilization exercises to HEP and facilitated stretch. R hemi pelvis remains inferiorly shifted compared to L.  GOALS: Goals reviewed with patient? Yes  SHORT TERM GOALS: Target date: 03/30/23  I with initial HEP Baseline: Goal status: 04/06/23-met  LONG TERM GOALS: Target date: 05/18/23  I  with final HEP Baseline:  Goal status: INITIAL  2.  Patient will perform sciatic neural tensioning with no pain Baseline:  Goal status: INITIAL  3.  Patient will report the ability to sleep through the night without being awakened by R hip and/or leg pain Baseline:  Goal status: INITIAL  4.  Increase B hip strength to 4+/5 Baseline:  Goal status: INITIAL  5.  Patient will perform SLS on either leg x 30 sec including gentle forward/back swing of opposite leg. Baseline:  Goal status: INITIAL  PLAN:  PT FREQUENCY: 2x/week  PT DURATION: 10 weeks  PLANNED  INTERVENTIONS: 97110-Therapeutic exercises, 97530- Therapeutic activity, 97112- Neuromuscular re-education, 97535- Self Care, 02859- Manual therapy, [phone]- Gait training, [phone]- Ionotophoresis 4mg /ml Dexamethasone, Patient/Family education, Balance training, Stair training, Dry Needling, Joint mobilization, Spinal mobilization, Cryotherapy, and Moist heat.  PLAN FOR NEXT SESSION: continue to assess SI, lumbar mobility   Devere CHRISTELLA Mean, PT, DPT 04/06/2023, 2:08 PM

## 2023-04-09 ENCOUNTER — Ambulatory Visit: Payer: Medicaid Other | Admitting: Physical Therapy

## 2023-04-09 ENCOUNTER — Encounter: Payer: Self-pay | Admitting: Physical Therapy

## 2023-04-09 DIAGNOSIS — M533 Sacrococcygeal disorders, not elsewhere classified: Secondary | ICD-10-CM

## 2023-04-09 DIAGNOSIS — R278 Other lack of coordination: Secondary | ICD-10-CM

## 2023-04-09 DIAGNOSIS — M6281 Muscle weakness (generalized): Secondary | ICD-10-CM

## 2023-04-09 DIAGNOSIS — R293 Abnormal posture: Secondary | ICD-10-CM

## 2023-04-09 NOTE — Therapy (Signed)
 OUTPATIENT PHYSICAL THERAPY THORACOLUMBAR TREATMENT   Patient Name: Krystal Lloyd MRN: [id number] DOB:[birth year], 33 y.o., female Today's Date: 04/09/2023  END OF SESSION:  PT End of Session - 04/09/23 1329     Visit Number 6    Date for PT Re-Evaluation 05/18/23    PT Start Time 1321    PT Stop Time 1355    PT Time Calculation (min) 34 min             Past Medical History:  Diagnosis Date   Hepatitis B    Medical history non-contributory    Tattoo 09/28/2019   Past Surgical History:  Procedure Laterality Date   DILATION AND CURETTAGE OF UTERUS N/A 11/29/2021   Procedure: DILATATION AND CURETTAGE;  Surgeon: Delana Ted Morrison, DO;  Location: [facility];  Service: Gynecology;  Laterality: N/A;   NO PAST SURGERIES     Patient Active Problem List   Diagnosis Date Noted   Retained products of conception after delivery without hemorrhage 11/29/2021   Term pregnancy 11/28/2021   Tattoo 09/28/2019   Chronic viral hepatitis B without delta-agent (HCC) 04/13/2019    PCP: None  REFERRING PROVIDER: Arnaldo Juliene RAMAN, MD   REFERRING DIAG: M54.50 (ICD-10-CM) - Low back pain, unspecified   Rationale for Evaluation and Treatment: Rehabilitation  THERAPY DIAG:  No diagnosis found.  ONSET DATE: 02/23/23  SUBJECTIVE:                                                                                                                                                                                           SUBJECTIVE STATEMENT: Patient reports that the pain is getting better. She does not have any pain right now.  PERTINENT HISTORY:  Per referring physician note: 2 month H/O LBP with R leg sciatica, worse when lying down,   PAIN:  Are you having pain? Yes: NPRS scale: 4-5/10 Pain location: R hip and into R leg Pain description: shooting, nerve pain Aggravating factors: Wakes her up at night, lying down Relieving factors: None, Ibuprofen  helps a little.  PRECAUTIONS:  Back  RED FLAGS: None   WEIGHT BEARING RESTRICTIONS: No  FALLS:  Has patient fallen in last 6 months? 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  PLOF: Independent  PATIENT GOALS: Patient would like to get relief from her pain and sleep without pain.  NEXT MD VISIT: none scheduled  OBJECTIVE:  Note: Objective measures were completed at Evaluation unless otherwise noted.  DIAGNOSTIC FINDINGS:  Had  Xray, but not able to see  COGNITION: Overall cognitive status: Within functional limits for tasks assessed     SENSATION: Patient reports T & N in R leg.  MUSCLE LENGTH: Hamstrings: Right 80 deg; Left 80 deg Thomas test: Right neutral deg; Left neutral deg  POSTURE: increased lumbar lordosis, decreased thoracic kyphosis, and left pelvic obliquity  PALPATION: No TTP in either hips or gluts. TTP over R SI joint  LUMBAR ROM: TBD  AROM eval  Flexion   Extension   Right lateral flexion   Left lateral flexion   Right rotation   Left rotation    (Blank rows = not tested)  LOWER EXTREMITY ROM: WNL in all planes, R hip IR/ER greater than L.  LOWER EXTREMITY MMT:  Patient with overall weakness, consistently (4-)-4/5 throughout hips, knees, ankles.  LUMBAR SPECIAL TESTS:  Straight leg raise test: Negative, Slump test: Positive, and FABER test: Positive  SI ASSESSMENT: L pelvic obliquity, R iliac crest, ASIS, PSIS all lower than L, (+) for pain with R iliac post and ant rotation, unchanged with repetition. R leg length < 1/4 shorter than L  FUNCTIONAL TESTS:    GAIT: Distance walked: In clinic distances Assistive device utilized: None Level of assistance: Complete Independence Comments: No obvious deviations noted.  TODAY'S TREATMENT:                                                                                                                               DATE:  04/09/23 NuStep L4 x 6 minutes. Supine stabilization for trunk and pelvis-iso add/abd with 5 sec holds, x 10 reps Standing AR with 10#, 2 x 10 each side Trigger Point Dry Needling  Subsequent Treatment: Instructions provided previously at initial dry needling treatment.   Patient Verbal Consent Given: Yes Education Handout Provided: Previously Provided Muscles Treated: right lumbar paraspinals, right glutes Treatment Response/Outcome: LTR Performed and documented by M. Albright, PT  Dead bugs x 5 each side, then modified dead bugs, alternating knee lift and opposite hand reach curl. Prone press up to stretch abdominals Prone hip ext with knee straight x 10 each Supine passive stretch, SKTC, piriformis, glut on each side, DKTC   04/06/23 Trigger Point Dry Needling  Subsequent Treatment: Instructions provided previously at initial dry needling treatment.   Patient Verbal Consent Given: Yes Education Handout Provided: Previously Provided Muscles Treated: right lumbar pspinals Treatment Response/Outcome: LTR's  Performed by:  CHRISTELLA Mainland, PT   NuStep L3 x 6 minutes Mini squats, 2 x 10 reps, emphasizing upright posture and control B side to side step against R tband resistance at knees. Standing shoulder ext, rows, ER against R tband resistance, 2 x 10 each Supine LTR, 5 sec holds, x 5 each way LTR to L with R hip flexed to 90 degrees. More stretch felt in the painful area, as well as ITB  04/02/23 Bike L2 x48mins  TPDN  Trigger Point Dry-Needling  Treatment instructions: Expect  mild to moderate muscle soreness. S/S of pneumothorax if dry needled over a lung field, and to seek immediate medical attention should they occur. Patient verbalized understanding of these instructions and education.  Patient Consent Given: Yes Education handout provided: Yes Muscles treated: R sacral paraspinals Electrical stimulation performed: No Parameters:  N/A Treatment response/outcome: Decreased muscle tension Provided by April Nonato, PT, DPT  Supine stretches  -HS, SKTC, DKTC, piriformis, glutes 30s  PPT 2x10 Deadbugs 2x10 alternating  Abdominal roll ups with ball 2x10 Feet on pball rotations, knees to chest, small bridges x10 Sidelying clamshells red 2x10 STS 2x10   03/26/23 Treadmill 1.8 mph x 5 min for warm up Supine trunk rotation 5x5 Supine piriformis stretch 2x30 Supine figure 4 stretch 2x30 Prone hip ext 2x10 (pt noted increased pain with this) Child's pose 2x30 Quadruped bird dog 2x10 (pt noted increased pain with this) Quadruped fire hydrant 2x10 Supine bilat bent leg lift 2x10 Standing PPT + wall squat 2x10 Manual therapy:  STM& TPR R sacral paraspinals/glute max  Skilled assessment and palpation for TPDN  Trigger Point Dry-Needling  Treatment instructions: Expect mild to moderate muscle soreness. S/S of pneumothorax if dry needled over a lung field, and to seek immediate medical attention should they occur. Patient verbalized understanding of these instructions and education.  Patient Consent Given: Yes Education handout provided: Yes Muscles treated: R sacral paraspinals Electrical stimulation performed: No Parameters: N/A Treatment response/outcome: Decreased muscle tension   03/16/23:   Assessed lumbar ROM: flexion wnl Extension 50% with pain provocation SB R 50% with pain provocation SB L wnl Seated lumbar flexion with no deviation noted lower lumbar spine  Assessed posture:  R lat pelvic shift noted, concavity R lumbar SI testing:  standing march test: decreased movement L Thigh thrust - B Faber's - B Prone R knee bend with pain provocation R SI region and increased radicular Sx R  Prone props with pain R SI  Supine with greater than 1 cm difference in ASIS height, R lower  Prone SITS bones mildly elevated R   Manual: utilized a variety of manual techniques to attempt to improve patient's  ability to isolate lumbar extension without increased Sx: Supine manually resisted hip ext with alt knee to chest to engage gluts and rotate pelvis, 5 sec holds, 3 reps each side  Supine with R hip extended, L hip flexed, for 5 sec resisted hold by PT to correct for possible R ant SI rotation  Side lying on L for lumbar ext, rot, SB muscle energy technique, increased pain note by pt so discontinued  Prone lying for manual distraction R hip, with thrust 3 reps, pt with some relief R LE radicular sx with this technique  Side lying on R side, for muscle energy technique for R lower lumbar flex, rot, SB restriction, resistance provided by PT for downward rotation B hips, improved pain definitely noted by pt with this technique  Instructed pt in self muscle energy in sitting, crossing legs and rotating trunk toward top leg with isometric trunk rotation opposite direction.  Pt advised to try prior to sleep, as she had some relief with this motion.  03/09/23 Education  PATIENT EDUCATION:  Education details: POC, HEP Person educated: Patient Education method: Explanation Education comprehension: verbalized understanding  HOME EXERCISE PROGRAM: [id number]  ASSESSMENT:  CLINICAL IMPRESSION: Patient is a 33 y.o. who participated  today with physical therapy treatment for 3 + month H/O R hip pain with radiation into RLE. She reports her back  pain started after giving birth 1 year ago.  Patient arrived late today. She reports that her pain is still present, but continues to diminish. She feels the DN is very effective. Continued with the DN today. Starting to shift focus to more emphasis on strengthening and stability as her pain continues to decrease  GOALS: Goals reviewed with patient? Yes  SHORT TERM GOALS: Target date: 03/30/23  I with initial HEP Baseline: Goal status: 04/06/23-met  LONG TERM GOALS: Target date: 05/18/23  I with final HEP Baseline:  Goal status: INITIAL  2.  Patient  will perform sciatic neural tensioning with no pain Baseline:  Goal status: INITIAL  3.  Patient will report the ability to sleep through the night without being awakened by R hip and/or leg pain Baseline:  Goal status: INITIAL  4.  Increase B hip strength to 4+/5 Baseline:  Goal status: INITIAL  5.  Patient will perform SLS on either leg x 30 sec including gentle forward/back swing of opposite leg. Baseline:  Goal status: INITIAL  PLAN:  PT FREQUENCY: 2x/week  PT DURATION: 10 weeks  PLANNED INTERVENTIONS: 97110-Therapeutic exercises, 97530- Therapeutic activity, 97112- Neuromuscular re-education, 97535- Self Care, [id number]- Manual therapy, [phone]- Gait training, [phone]- Ionotophoresis 4mg /ml Dexamethasone, Patient/Family education, Balance training, Stair training, Dry Needling, Joint mobilization, Spinal mobilization, Cryotherapy, and Moist heat.  PLAN FOR NEXT SESSION: continue to assess SI, lumbar mobility   Devere CHRISTELLA Mean, PT, DPT 04/09/2023, 1:58 PM

## 2023-04-16 ENCOUNTER — Ambulatory Visit: Payer: Medicaid Other | Admitting: Physical Therapy

## 2023-04-23 ENCOUNTER — Ambulatory Visit: Payer: Medicaid Other | Admitting: Physical Therapy

## 2023-04-30 ENCOUNTER — Ambulatory Visit: Payer: Medicaid Other | Admitting: Physical Therapy
